# Patient Record
Sex: Male | Born: 1965 | Race: White | Hispanic: No | Marital: Married | Smoking: Never smoker
Health system: Southern US, Community
[De-identification: ages and names within clinical notes are randomized; demographics above are authoritative.]

## PROBLEM LIST (undated history)

## (undated) DIAGNOSIS — N189 Chronic kidney disease, unspecified: Secondary | ICD-10-CM

## (undated) DIAGNOSIS — E785 Hyperlipidemia, unspecified: Secondary | ICD-10-CM

## (undated) DIAGNOSIS — Z86010 Personal history of colonic polyps: Secondary | ICD-10-CM

## (undated) DIAGNOSIS — I10 Essential (primary) hypertension: Secondary | ICD-10-CM

## (undated) HISTORY — DX: Personal history of colonic polyps: Z86.010

## (undated) HISTORY — DX: Hyperlipidemia, unspecified: E78.5

## (undated) HISTORY — DX: Essential (primary) hypertension: I10

## (undated) HISTORY — DX: Chronic kidney disease, unspecified: N18.9

---

## 1999-04-16 ENCOUNTER — Emergency Department (HOSPITAL_COMMUNITY): Admission: EM | Admit: 1999-04-16 | Discharge: 1999-04-16 | Payer: Self-pay | Admitting: Emergency Medicine

## 1999-04-16 ENCOUNTER — Encounter: Payer: Self-pay | Admitting: Emergency Medicine

## 2004-11-09 ENCOUNTER — Emergency Department (HOSPITAL_COMMUNITY): Admission: EM | Admit: 2004-11-09 | Discharge: 2004-11-10 | Payer: Self-pay | Admitting: Emergency Medicine

## 2005-03-19 ENCOUNTER — Encounter: Admission: RE | Admit: 2005-03-19 | Discharge: 2005-03-19 | Payer: Self-pay | Admitting: General Surgery

## 2006-07-22 ENCOUNTER — Ambulatory Visit: Payer: Self-pay | Admitting: Family Medicine

## 2006-08-01 ENCOUNTER — Ambulatory Visit: Payer: Self-pay | Admitting: Family Medicine

## 2006-08-11 ENCOUNTER — Ambulatory Visit: Payer: Self-pay | Admitting: Family Medicine

## 2007-01-09 ENCOUNTER — Ambulatory Visit: Payer: Self-pay | Admitting: Family Medicine

## 2007-01-09 LAB — CONVERTED CEMR LAB
ALT: 25 units/L (ref 0–40)
AST: 21 units/L (ref 0–37)
BUN: 21 mg/dL (ref 6–23)
CO2: 29 meq/L (ref 19–32)
Calcium: 9.2 mg/dL (ref 8.4–10.5)
Chloride: 101 meq/L (ref 96–112)
Cholesterol: 179 mg/dL (ref 0–200)
Creatinine, Ser: 1.4 mg/dL (ref 0.4–1.5)
Creatinine,U: 89 mg/dL
Direct LDL: 113.9 mg/dL
GFR calc Af Amer: 71 mL/min
GFR calc non Af Amer: 59 mL/min
Glucose, Bld: 213 mg/dL — ABNORMAL HIGH (ref 70–99)
HDL: 31.9 mg/dL — ABNORMAL LOW (ref >39.0)
Hgb A1c MFr Bld: 7 % — ABNORMAL HIGH (ref 4.6–6.0)
Microalb Creat Ratio: 187.6 mg/g — ABNORMAL HIGH (ref 0.0–30.0)
Microalb, Ur: 16.7 mg/dL — ABNORMAL HIGH (ref 0.0–1.9)
Potassium: 4.4 meq/L (ref 3.5–5.1)
Sodium: 139 meq/L (ref 135–145)
Total CHOL/HDL Ratio: 5.6
Triglycerides: 281 mg/dL (ref 0–149)
VLDL: 56 mg/dL — ABNORMAL HIGH (ref 0–40)

## 2007-02-22 DIAGNOSIS — I10 Essential (primary) hypertension: Secondary | ICD-10-CM | POA: Insufficient documentation

## 2007-02-22 DIAGNOSIS — M109 Gout, unspecified: Secondary | ICD-10-CM | POA: Insufficient documentation

## 2007-02-22 DIAGNOSIS — R7989 Other specified abnormal findings of blood chemistry: Secondary | ICD-10-CM | POA: Insufficient documentation

## 2007-04-27 ENCOUNTER — Telehealth (INDEPENDENT_AMBULATORY_CARE_PROVIDER_SITE_OTHER): Payer: Self-pay | Admitting: *Deleted

## 2007-05-22 ENCOUNTER — Ambulatory Visit: Payer: Self-pay | Admitting: Family Medicine

## 2007-05-24 DIAGNOSIS — R944 Abnormal results of kidney function studies: Secondary | ICD-10-CM | POA: Insufficient documentation

## 2007-05-24 LAB — CONVERTED CEMR LAB
ALT: 19 units/L (ref 0–53)
AST: 18 units/L (ref 0–37)
BUN: 24 mg/dL — ABNORMAL HIGH (ref 6–23)
CO2: 30 meq/L (ref 19–32)
Calcium: 9 mg/dL (ref 8.4–10.5)
Chloride: 104 meq/L (ref 96–112)
Cholesterol: 125 mg/dL (ref 0–200)
Creatinine, Ser: 1.8 mg/dL — ABNORMAL HIGH (ref 0.4–1.5)
Direct LDL: 67.5 mg/dL
GFR calc Af Amer: 53 mL/min
GFR calc non Af Amer: 44 mL/min
Glucose, Bld: 201 mg/dL — ABNORMAL HIGH (ref 70–99)
HDL: 26.6 mg/dL — ABNORMAL LOW (ref >39.0)
Potassium: 3.8 meq/L (ref 3.5–5.1)
Sodium: 138 meq/L (ref 135–145)
Total CHOL/HDL Ratio: 4.7
Triglycerides: 250 mg/dL (ref 0–149)
VLDL: 50 mg/dL — ABNORMAL HIGH (ref 0–40)

## 2007-05-25 ENCOUNTER — Telehealth (INDEPENDENT_AMBULATORY_CARE_PROVIDER_SITE_OTHER): Payer: Self-pay | Admitting: *Deleted

## 2007-05-26 ENCOUNTER — Telehealth (INDEPENDENT_AMBULATORY_CARE_PROVIDER_SITE_OTHER): Payer: Self-pay | Admitting: *Deleted

## 2007-05-26 ENCOUNTER — Ambulatory Visit: Payer: Self-pay | Admitting: Family Medicine

## 2007-05-26 LAB — CONVERTED CEMR LAB
BUN: 21 mg/dL (ref 6–23)
Calcium: 9.4 mg/dL (ref 8.4–10.5)
Chloride: 101 meq/L (ref 96–112)
Creatinine, Ser: 1.4 mg/dL (ref 0.4–1.5)
GFR calc non Af Amer: 59 mL/min

## 2007-06-08 ENCOUNTER — Encounter: Admission: RE | Admit: 2007-06-08 | Discharge: 2007-06-08 | Payer: Self-pay | Admitting: Family Medicine

## 2007-06-09 ENCOUNTER — Telehealth (INDEPENDENT_AMBULATORY_CARE_PROVIDER_SITE_OTHER): Payer: Self-pay | Admitting: *Deleted

## 2007-06-21 ENCOUNTER — Telehealth (INDEPENDENT_AMBULATORY_CARE_PROVIDER_SITE_OTHER): Payer: Self-pay | Admitting: *Deleted

## 2007-11-27 ENCOUNTER — Telehealth (INDEPENDENT_AMBULATORY_CARE_PROVIDER_SITE_OTHER): Payer: Self-pay | Admitting: *Deleted

## 2008-01-19 ENCOUNTER — Telehealth (INDEPENDENT_AMBULATORY_CARE_PROVIDER_SITE_OTHER): Payer: Self-pay | Admitting: *Deleted

## 2008-01-25 ENCOUNTER — Ambulatory Visit: Payer: Self-pay | Admitting: Family Medicine

## 2008-01-25 LAB — CONVERTED CEMR LAB
Chloride: 103 meq/L (ref 96–112)
Cholesterol: 162 mg/dL (ref 0–200)
Creatinine,U: 195.9 mg/dL
GFR calc non Af Amer: 64 mL/min
Glucose, Bld: 149 mg/dL — ABNORMAL HIGH (ref 70–99)
HDL: 25.3 mg/dL — ABNORMAL LOW (ref >39.0)
Hgb A1c MFr Bld: 6.3 % — ABNORMAL HIGH (ref 4.6–6.0)
Potassium: 4.1 meq/L (ref 3.5–5.1)
Sodium: 136 meq/L (ref 135–145)
Total CHOL/HDL Ratio: 6.4
Triglycerides: 204 mg/dL (ref 0–149)
Uric Acid, Serum: 10.6 mg/dL — ABNORMAL HIGH (ref 2.4–7.0)
VLDL: 41 mg/dL — ABNORMAL HIGH (ref 0–40)

## 2008-01-26 ENCOUNTER — Encounter (INDEPENDENT_AMBULATORY_CARE_PROVIDER_SITE_OTHER): Payer: Self-pay | Admitting: *Deleted

## 2008-01-26 ENCOUNTER — Telehealth (INDEPENDENT_AMBULATORY_CARE_PROVIDER_SITE_OTHER): Payer: Self-pay | Admitting: *Deleted

## 2008-01-29 ENCOUNTER — Telehealth (INDEPENDENT_AMBULATORY_CARE_PROVIDER_SITE_OTHER): Payer: Self-pay | Admitting: *Deleted

## 2008-02-01 ENCOUNTER — Encounter (INDEPENDENT_AMBULATORY_CARE_PROVIDER_SITE_OTHER): Payer: Self-pay | Admitting: *Deleted

## 2008-02-22 ENCOUNTER — Ambulatory Visit: Payer: Self-pay | Admitting: Family Medicine

## 2008-02-22 DIAGNOSIS — E785 Hyperlipidemia, unspecified: Secondary | ICD-10-CM | POA: Insufficient documentation

## 2008-03-08 ENCOUNTER — Ambulatory Visit: Payer: Self-pay | Admitting: Family Medicine

## 2008-03-12 ENCOUNTER — Encounter: Payer: Self-pay | Admitting: Family Medicine

## 2008-04-26 ENCOUNTER — Ambulatory Visit: Payer: Self-pay | Admitting: Family Medicine

## 2008-04-27 LAB — CONVERTED CEMR LAB
ALT: 28 units/L (ref 0–53)
CO2: 26 meq/L (ref 19–32)
Calcium: 9 mg/dL (ref 8.4–10.5)
Cholesterol: 174 mg/dL (ref 0–200)
Creatinine, Ser: 1.4 mg/dL (ref 0.4–1.5)
Direct LDL: 111.7 mg/dL
GFR calc Af Amer: 71 mL/min
Glucose, Bld: 129 mg/dL — ABNORMAL HIGH (ref 70–99)
HDL: 29.1 mg/dL — ABNORMAL LOW (ref >39.0)
Hgb A1c MFr Bld: 6.1 % — ABNORMAL HIGH (ref 4.6–6.0)
Sodium: 138 meq/L (ref 135–145)
Total CHOL/HDL Ratio: 6
Total Protein: 7.2 g/dL (ref 6.0–8.3)
Triglycerides: 228 mg/dL (ref 0–149)
Uric Acid, Serum: 11.7 mg/dL — ABNORMAL HIGH (ref 4.0–7.8)

## 2008-04-29 ENCOUNTER — Encounter (INDEPENDENT_AMBULATORY_CARE_PROVIDER_SITE_OTHER): Payer: Self-pay | Admitting: *Deleted

## 2008-05-20 ENCOUNTER — Ambulatory Visit: Payer: Self-pay | Admitting: Family Medicine

## 2008-07-10 ENCOUNTER — Telehealth (INDEPENDENT_AMBULATORY_CARE_PROVIDER_SITE_OTHER): Payer: Self-pay | Admitting: *Deleted

## 2008-08-20 ENCOUNTER — Ambulatory Visit: Payer: Self-pay | Admitting: Family Medicine

## 2008-08-29 ENCOUNTER — Encounter (INDEPENDENT_AMBULATORY_CARE_PROVIDER_SITE_OTHER): Payer: Self-pay | Admitting: *Deleted

## 2008-08-29 LAB — CONVERTED CEMR LAB
ALT: 26 units/L (ref 0–53)
AST: 28 units/L (ref 0–37)
Alkaline Phosphatase: 91 units/L (ref 39–117)
Bilirubin, Direct: 0.1 mg/dL (ref 0.0–0.3)
CO2: 27 meq/L (ref 19–32)
Chloride: 103 meq/L (ref 96–112)
Glucose, Bld: 127 mg/dL — ABNORMAL HIGH (ref 70–99)
Potassium: 4.1 meq/L (ref 3.5–5.1)
Sodium: 139 meq/L (ref 135–145)
Total Bilirubin: 0.7 mg/dL (ref 0.3–1.2)
Total CHOL/HDL Ratio: 4.2

## 2008-10-29 ENCOUNTER — Telehealth (INDEPENDENT_AMBULATORY_CARE_PROVIDER_SITE_OTHER): Payer: Self-pay | Admitting: *Deleted

## 2008-11-04 ENCOUNTER — Telehealth (INDEPENDENT_AMBULATORY_CARE_PROVIDER_SITE_OTHER): Payer: Self-pay | Admitting: *Deleted

## 2009-03-05 ENCOUNTER — Ambulatory Visit: Payer: Self-pay | Admitting: Family Medicine

## 2009-03-06 ENCOUNTER — Ambulatory Visit: Payer: Self-pay | Admitting: Family Medicine

## 2009-03-06 ENCOUNTER — Telehealth (INDEPENDENT_AMBULATORY_CARE_PROVIDER_SITE_OTHER): Payer: Self-pay | Admitting: *Deleted

## 2009-03-11 LAB — CONVERTED CEMR LAB
Albumin: 3.5 g/dL (ref 3.5–5.2)
Alkaline Phosphatase: 94 units/L (ref 39–117)
BUN: 23 mg/dL (ref 6–23)
CO2: 27 meq/L (ref 19–32)
Calcium: 9.4 mg/dL (ref 8.4–10.5)
Cholesterol: 122 mg/dL (ref 0–200)
Creatinine, Ser: 1.5 mg/dL (ref 0.4–1.5)
Glucose, Bld: 129 mg/dL — ABNORMAL HIGH (ref 70–99)
HDL: 25.9 mg/dL — ABNORMAL LOW (ref >39.00)
Hgb A1c MFr Bld: 5.9 % (ref 4.6–6.5)
Microalb Creat Ratio: 100.3 mg/g — ABNORMAL HIGH (ref 0.0–30.0)
Microalb, Ur: 10.8 mg/dL — ABNORMAL HIGH (ref 0.0–1.9)
Sodium: 138 meq/L (ref 135–145)
Total Protein: 7.4 g/dL (ref 6.0–8.3)
Uric Acid, Serum: 11.3 mg/dL — ABNORMAL HIGH (ref 4.0–7.8)

## 2009-03-12 ENCOUNTER — Encounter (INDEPENDENT_AMBULATORY_CARE_PROVIDER_SITE_OTHER): Payer: Self-pay | Admitting: *Deleted

## 2009-05-15 ENCOUNTER — Ambulatory Visit: Payer: Self-pay | Admitting: Internal Medicine

## 2009-12-24 ENCOUNTER — Telehealth: Payer: Self-pay | Admitting: Family Medicine

## 2009-12-25 ENCOUNTER — Telehealth (INDEPENDENT_AMBULATORY_CARE_PROVIDER_SITE_OTHER): Payer: Self-pay | Admitting: *Deleted

## 2010-01-01 ENCOUNTER — Ambulatory Visit: Payer: Self-pay | Admitting: Family Medicine

## 2010-01-02 ENCOUNTER — Telehealth (INDEPENDENT_AMBULATORY_CARE_PROVIDER_SITE_OTHER): Payer: Self-pay | Admitting: *Deleted

## 2010-01-02 LAB — CONVERTED CEMR LAB
Calcium: 9 mg/dL (ref 8.4–10.5)
Chloride: 103 meq/L (ref 96–112)
Creatinine, Ser: 1.4 mg/dL (ref 0.4–1.5)
Uric Acid, Serum: 8.7 mg/dL — ABNORMAL HIGH (ref 4.0–7.8)

## 2010-01-06 ENCOUNTER — Telehealth: Payer: Self-pay | Admitting: Family Medicine

## 2010-01-20 ENCOUNTER — Telehealth: Payer: Self-pay | Admitting: Family Medicine

## 2010-01-26 ENCOUNTER — Telehealth: Payer: Self-pay | Admitting: Family Medicine

## 2010-01-29 ENCOUNTER — Ambulatory Visit: Payer: Self-pay | Admitting: Family Medicine

## 2010-02-09 ENCOUNTER — Telehealth (INDEPENDENT_AMBULATORY_CARE_PROVIDER_SITE_OTHER): Payer: Self-pay | Admitting: *Deleted

## 2010-02-09 LAB — CONVERTED CEMR LAB
ALT: 24 units/L (ref 0–53)
AST: 25 units/L (ref 0–37)
Albumin: 3.6 g/dL (ref 3.5–5.2)
Alkaline Phosphatase: 91 units/L (ref 39–117)
BUN: 12 mg/dL (ref 6–23)
Basophils Absolute: 0.1 10*3/uL (ref 0.0–0.1)
Basophils Relative: 0.7 % (ref 0.0–3.0)
Bilirubin, Direct: 0.1 mg/dL (ref 0.0–0.3)
CO2: 32 meq/L (ref 19–32)
Calcium: 9.1 mg/dL (ref 8.4–10.5)
Chloride: 103 meq/L (ref 96–112)
Cholesterol: 163 mg/dL (ref 0–200)
Creatinine, Ser: 1.3 mg/dL (ref 0.4–1.5)
Creatinine,U: 56.7 mg/dL
Eosinophils Absolute: 0.4 10*3/uL (ref 0.0–0.7)
Eosinophils Relative: 5.3 % — ABNORMAL HIGH (ref 0.0–5.0)
GFR calc non Af Amer: 63.23 mL/min (ref >60)
Glucose, Bld: 113 mg/dL — ABNORMAL HIGH (ref 70–99)
HCT: 42.9 % (ref 39.0–52.0)
HDL: 39.2 mg/dL (ref >39.00)
Hemoglobin: 14.6 g/dL (ref 13.0–17.0)
Hgb A1c MFr Bld: 5.9 % (ref 4.6–6.5)
LDL Cholesterol: 92 mg/dL (ref 0–99)
Lymphocytes Relative: 22.2 % (ref 12.0–46.0)
Lymphs Abs: 1.8 10*3/uL (ref 0.7–4.0)
MCHC: 34 g/dL (ref 30.0–36.0)
MCV: 96.4 fL (ref 78.0–100.0)
Microalb Creat Ratio: 1393.3 mg/g — ABNORMAL HIGH (ref 0.0–30.0)
Microalb, Ur: 79 mg/dL — ABNORMAL HIGH (ref 0.0–1.9)
Monocytes Absolute: 0.8 10*3/uL (ref 0.1–1.0)
Monocytes Relative: 10.3 % (ref 3.0–12.0)
Neutro Abs: 4.9 10*3/uL (ref 1.4–7.7)
Neutrophils Relative %: 61.5 % (ref 43.0–77.0)
PSA: 0.48 ng/mL (ref 0.10–4.00)
Platelets: 306 10*3/uL (ref 150.0–400.0)
Potassium: 3.6 meq/L (ref 3.5–5.1)
RBC: 4.45 M/uL (ref 4.22–5.81)
RDW: 12.7 % (ref 11.5–14.6)
Sodium: 140 meq/L (ref 135–145)
Total Bilirubin: 0.5 mg/dL (ref 0.3–1.2)
Total CHOL/HDL Ratio: 4
Total Protein: 7.8 g/dL (ref 6.0–8.3)
Triglycerides: 160 mg/dL — ABNORMAL HIGH (ref 0.0–149.0)
Uric Acid, Serum: 8.2 mg/dL — ABNORMAL HIGH (ref 4.0–7.8)
VLDL: 32 mg/dL (ref 0.0–40.0)
WBC: 8 10*3/uL (ref 4.5–10.5)

## 2010-04-01 ENCOUNTER — Telehealth: Payer: Self-pay | Admitting: Family Medicine

## 2010-12-15 NOTE — Progress Notes (Signed)
Summary: FAILED RHEUMATOLOGY REFERRAL  Phone Note Outgoing Call   Call placed by: Magdalen Spatz [facility],  January 26, 2010 2:39 PM Call placed to: Patient Summary of Call: IN REFERENCE TO RHEUMATOLOGY REFERRAL, I JUST S/W PATIENT TO INFORM HIM OF HIS OPTIONS W/LOCAL RHEUM AS A "SELF-PAY" PATIENT, AND HE IS NOW DECLINING REFERRAL, STATES HE HAS HIS CONDITION UNDER CONTROL, & CAN NOT AFFORD ANY MORE SPECIALISTS.  Initial call taken by: Magdalen Spatz [facility],  January 26, 2010 2:39 PM

## 2010-12-15 NOTE — Progress Notes (Signed)
Summary: WANTS ALTERNATIVE MED TO LISINOPRIL  Phone Note Call from Patient Call back at Home Phone [phone] Call back at Work Phone [phone]   Caller: Patient Call For: Loreen Freud DO Reason for Call: Refill Medication Summary of Call: PATIENT SPOUSE, CONNIE, CALLING, STATES LISINOPRIL RX WAS OVER $100 & THERE IS NO GENERIC.  [facility] [address] DOES HAVE ALLOPURINOL FOR $4.  WILL YOU PRESCRIBE THIS INSTEAD? Initial call taken by: Magdalen Spatz [facility],  January 06, 2010 2:11 PM  Follow-up for Phone Call        U mean uloric?  lisinopril is a bp med.   allopurinol 100 mg  #30  1 by mouth once daily---recheck uric acid in 1 month Follow-up by: Loreen Freud DO,  January 06, 2010 2:40 PM  Additional Follow-up for Phone Call Additional follow up Details #1::        Pts wife states it is the uloric. Aware that I sent in the new meds for him and to recheck his bloodwork in 1 month. Army Fossa CMA  January 06, 2010 2:58 PM     New/Updated Medications: ALLOPURINOL 100 MG TABS (ALLOPURINOL) 1 by mouth daily. Prescriptions: ALLOPURINOL 100 MG TABS (ALLOPURINOL) 1 by mouth daily.  #30 x 0   Entered by:   Army Fossa CMA   Authorized by:   Loreen Freud DO   Signed by:   Army Fossa CMA on 01/06/2010   Method used:   Electronically to        [facility] (retail)       [address]       [place], [place]  [address]       Ph: [phone]       Fax: [phone]   RxID:   [id number]

## 2010-12-15 NOTE — Progress Notes (Signed)
Summary: Lab Results  Phone Note Outgoing Call   Call placed by: Army Fossa CMA,  February 09, 2010 9:45 AM Reason for Call: Discuss lab or test results Summary of Call: Regarding lab results, LMTCB:  Ideally your LDL (bad cholesterol) should be <__100__, your HDL (good cholesterol) should be >__40_ and your triglycerides should be< 150.  Diet and exercise will increase HDL and decrease the LDL and triglycerides. Add Antara 130mg   #30, 1 by mouth  once daily,  2 refills.  We will recheck labs in _3__ months.   lipid, hep, bmp, hgba1c  250.00 272.4   If pt is symptomatic with gout we will need to tx elevated uric acid as well. Signed by Loreen Freud DO on 02/07/2010 at 3:19 PM  Follow-up for Phone Call        Left message for pt to call back. Army Fossa CMA  February 11, 2010 1:13 PM   Additional Follow-up for Phone Call Additional follow up Details #1::        left message to call back. Army Fossa CMA  February 16, 2010 2:45 PM     Additional Follow-up for Phone Call Additional follow up Details #2::    Pt states that his gout is doing well. And is aware of the rest of the lab results. Army Fossa CMA  February 16, 2010 3:33 PM   New/Updated Medications: ANTARA 130 MG CAPS (FENOFIBRATE MICRONIZED) 1 by mouth daily. Prescriptions: ANTARA 130 MG CAPS (FENOFIBRATE MICRONIZED) 1 by mouth daily.  #30 x 2   Entered by:   Army Fossa CMA   Authorized by:   Loreen Freud DO   Signed by:   Army Fossa CMA on 02/16/2010   Method used:   Electronically to        [facility] (retail)       [address]       [place], [place]  [address]       Ph: [phone]       Fax: [phone]   RxID:   [id number]

## 2010-12-15 NOTE — Progress Notes (Signed)
Summary: Med Change   Phone Note Call from Patient Call back at [phone]   Caller: Spouse Summary of Call: Pts wife said that [facility] no longer carries Colchicine. She said that there recommendation is going to be Allopurinol.Please advise. Army Fossa CMA  December 25, 2009 8:33 AM   Follow-up for Phone Call        we need to check uric acid level and bmp (for kidney function) first.  279.4 Follow-up by: Loreen Freud DO,  December 25, 2009 9:20 AM  Additional Follow-up for Phone Call Additional follow up Details #1::        left message to call back Army Fossa CMA  December 25, 2009 10:02 AM     Additional Follow-up for Phone Call Additional follow up Details #2::    Left message on VM informing patient to please call to schedule appointment for labs before med change. Once we get labs back we will consider changing meds. Shonna Chock  December 26, 2009 11:53 AM

## 2010-12-15 NOTE — Progress Notes (Signed)
Summary: DISCUSS MEDICATION  Phone Note Call from Patient Call back at Home Phone [phone] Call back at [phone] Rodney [name]   Caller: Spouse Call For: Rodney [name] Reason for Call: Refill Medication, Talk to Nurse Summary of Call: PATIENT'S [name], Rodney [name] CALLING, MEDICATION COLCHICINE, THEY HEARD THAT IT WILL BE DISCONTINUED.  ALSO THE MEDICATION IS TOO EXPENSIVE, THERE IS NO GENERIC, AND THE PATIENT HAS NO INSURANCE.  ARE THERE ANY ALTERNATIVES TO THIS MEDICATION, OR ASSISTANCE?  PLEASE CALL PT.  PATIENT SCH'D FOR CPX ON 01-29-2010 W/DR. LOWNE. Initial call taken by: Magdalen Spatz [facility],  December 24, 2009 1:23 PM  Follow-up for Phone Call        That is the cheapest --- give the discount card we have in folder on counter. Follow-up by: Rodney [name],  December 24, 2009 1:56 PM  Additional Follow-up for Phone Call Additional follow up Details #1::        Left message for [name]. I put the traid care card up front for them to pick up. Army Fossa CMA  December 24, 2009 2:06 PM     Additional Follow-up for Phone Call Additional follow up Details #2::    Pt is aware. Army Fossa CMA  December 24, 2009 2:12 PM

## 2010-12-15 NOTE — Progress Notes (Signed)
Summary: stop med  Phone Note Call from Patient Call back at [facility] Phone [phone]   Caller: Patient Summary of Call: pt stop uloric 40 mg due to bodyaches, trouble with urination and urine turninhg a brownish color..pt would like to know if there is another med that he can take in its place. pls advise...........Marland KitchenFelecia Deloach CMA  January 20, 2010 4:57 PM   Follow-up for Phone Call        I called the pt and left a message we have him on Allopurinol not uloric. Army Fossa CMA  January 20, 2010 5:08 PM   Additional Follow-up for Phone Call Additional follow up Details #1::        I spoke with the pts wife an she states that he is taking the Allopurinol. Army Fossa CMA  January 21, 2010 8:36 AM     Additional Follow-up for Phone Call Additional follow up Details #2::    only other medicine is the uloric-- but he said that was too expensive if he is struggling with symptoms we can refer to rheumatologist to see about other options Follow-up by: Loreen Freud DO,  January 21, 2010 8:56 AM  Additional Follow-up for Phone Call Additional follow up Details #3:: Details for Additional Follow-up Action Taken: Pt wifes is aware. They would like to see the rehumatologist. Army Fossa CMA  January 21, 2010 10:22 AM

## 2010-12-15 NOTE — Progress Notes (Signed)
Summary: Med to expensive  Phone Note Call from Patient Call back at [phone]   Caller: Spouse Summary of Call: Pts wife called and stated he cannot afford Anatar, he needs some type of medicine that is no more than $25.00. Pt does not have any insurance. Please advise. Army Fossa CMA  Apr 01, 2010 11:42 AM   Follow-up for Phone Call        diet and exercise are very important---he can try otc fish oil 2 by mouth two times a day  Follow-up by: Loreen Freud DO,  Apr 01, 2010 11:55 AM  Additional Follow-up for Phone Call Additional follow up Details #1::        Pts wife is aware. Army Fossa CMA  Apr 01, 2010 11:59 AM     New/Updated Medications: FISH OIL 1000 MG CAPS (OMEGA-3 FATTY ACIDS) 2 by mouth two times a day

## 2010-12-15 NOTE — Assessment & Plan Note (Signed)
Summary: CPX, FASTING, NO INSUR-SELF PAY/RH.....   Vital Signs:  Patient profile:   45 year old male Weight:      237 pounds Temp:     98.7 degrees F oral Pulse rate:   76 / minute Pulse rhythm:   regular BP sitting:   126 / 76  (left arm) Cuff size:   regular  Vitals Entered By: Army Fossa CMA (January 29, 2010 8:44 AM) CC: Pt does not want CPX, here for a BP follow up.    History of Present Illness:  Type 1 diabetes mellitus follow-up      This is a 45 year old man who presents with Type 2 diabetes mellitus follow-up.  The patient denies polyuria, polydipsia, blurred vision, self managed hypoglycemia, hypoglycemia requiring help, weight loss, weight gain, and numbness of extremities.  The patient denies the following symptoms: neuropathic pain, chest pain, vomiting, orthostatic symptoms, poor wound healing, intermittent claudication, vision loss, and foot ulcer.  Since the last visit the patient reports good dietary compliance, compliance with medications, exercising regularly, and monitoring blood glucose.  The patient has been measuring capillary blood glucose before breakfast.  Since the last visit, the patient reports having had no eye care and no foot care.    Hyperlipidemia follow-up      The patient also presents for Hyperlipidemia follow-up.  The patient denies muscle aches, GI upset, abdominal pain, flushing, itching, constipation, diarrhea, and fatigue.  The patient denies the following symptoms: chest pain/pressure, exercise intolerance, dypsnea, palpitations, syncope, and pedal edema.  Compliance with medications (by patient report) has been near 100%.  Dietary compliance has been good.  The patient reports exercising 3-4X per week.  Adjunctive measures currently used by the patient include ASA and limiting alcohol consumpton.    Hypertension follow-up      The patient also presents for Hypertension follow-up.  The patient denies lightheadedness, urinary frequency,  headaches, edema, impotence, rash, and fatigue.  The patient denies the following associated symptoms: chest pain, chest pressure, exercise intolerance, dyspnea, palpitations, syncope, leg edema, and pedal edema.  Compliance with medications (by patient report) has been near 100%.  The patient reports that dietary compliance has been good.  The patient reports exercising 3-4X per week.  Adjunctive measures currently used by the patient include salt restriction.    Allergies: 1)  ! Zocor (Simvastatin)  Past History:  Past medical, surgical, family and social histories (including risk factors) reviewed for relevance to current acute and chronic problems.  Past Medical History: Reviewed history from 02/22/2008 and no changes required. Gout Hypertension Hyperlipidemia Diabetes mellitus, type II  Family History: Reviewed history and no changes required.  Social History: Reviewed history and no changes required.  Physical Exam  General:  Well-developed,well-nourished,in no acute distress; alert,appropriate and cooperative throughout examination Neck:  No deformities, masses, or tenderness noted. Lungs:  Normal respiratory effort, chest expands symmetrically. Lungs are clear to auscultation, no crackles or wheezes. Heart:  Normal rate and regular rhythm. S1 and S2 normal without gallop, murmur, click, rub or other extra sounds. Extremities:  No clubbing, cyanosis, edema, or deformity noted with normal full range of motion of all joints.    Diabetes Management Exam:    Foot Exam (with socks and/or shoes not present):       Sensory-Pinprick/Light touch:          Left medial foot (L-4): normal          Left dorsal foot (L-5): normal  Left lateral foot (S-1): normal          Right medial foot (L-4): normal          Right dorsal foot (L-5): normal          Right lateral foot (S-1): normal       Sensory-Monofilament:          Left foot: normal          Right foot: normal        Inspection:          Left foot: normal          Right foot: normal       Nails:          Left foot: normal          Right foot: normal    Eye Exam:       Eye Exam done here today   Impression & Recommendations:  Problem # 1:  DIABETES MELLITUS, TYPE II (ICD-250.00)  His updated medication list for this problem includes:    Adult Aspirin Ec Low Strength 81 Mg Tbec (Aspirin) .Marland Kitchen... Take one tablet daily    Lisinopril 20 Mg Tabs (Lisinopril) .Marland Kitchen... 1 by mouth once daily. **office visit due**  Orders: Venipuncture (09811) TLB-Lipid Panel (80061-LIPID) TLB-BMP (Basic Metabolic Panel-BMET) (80048-METABOL) TLB-CBC Platelet - w/Differential (85025-CBCD) TLB-Hepatic/Liver Function Pnl (80076-HEPATIC) TLB-A1C / Hgb A1C (Glycohemoglobin) (83036-A1C) TLB-Microalbumin/Creat Ratio, Urine (82043-MALB) TLB-Uric Acid, Blood (84550-URIC) TLB-PSA (Prostate Specific Antigen) (84153-PSA)  Labs Reviewed: Creat: 1.4 (01/01/2010)    Reviewed HgBA1c results: 5.9 (03/06/2009)  6.2 (08/20/2008)  Problem # 2:  HYPERLIPIDEMIA (ICD-272.4)  His updated medication list for this problem includes:    Pravachol 20 Mg Tabs (Pravastatin sodium) .Marland Kitchen... Take one tablet each evening at bedtime.  Orders: Venipuncture (91478) TLB-Lipid Panel (80061-LIPID) TLB-BMP (Basic Metabolic Panel-BMET) (80048-METABOL) TLB-CBC Platelet - w/Differential (85025-CBCD) TLB-Hepatic/Liver Function Pnl (80076-HEPATIC) TLB-A1C / Hgb A1C (Glycohemoglobin) (83036-A1C) TLB-Microalbumin/Creat Ratio, Urine (82043-MALB) TLB-Uric Acid, Blood (84550-URIC) TLB-PSA (Prostate Specific Antigen) (84153-PSA)  Labs Reviewed: SGOT: 19 (03/06/2009)   SGPT: 16 (03/06/2009)  Prior 10 Yr Risk Heart Disease: 11 % (05/15/2009)   HDL:25.90 (03/06/2009), 28.3 (08/20/2008)  LDL:73 (03/06/2009), 52 (08/20/2008)  Chol:122 (03/06/2009), 118 (08/20/2008)  Trig:115.0 (03/06/2009), 188 (08/20/2008)  Problem # 3:  HYPERTENSION (ICD-401.9)  His updated  medication list for this problem includes:    Verapamil Hcl 120 Mg Tabs (Verapamil hcl) .Marland Kitchen... Take one and a half tablet two times a day    Atenolol 100 Mg Tabs (Atenolol) .Marland Kitchen... Take 1/2 tablet by mouth each day.    Lisinopril 20 Mg Tabs (Lisinopril) .Marland Kitchen... 1 by mouth once daily. **office visit due**  Orders: Venipuncture (29562) TLB-Lipid Panel (80061-LIPID) TLB-BMP (Basic Metabolic Panel-BMET) (80048-METABOL) TLB-CBC Platelet - w/Differential (85025-CBCD) TLB-Hepatic/Liver Function Pnl (80076-HEPATIC) TLB-A1C / Hgb A1C (Glycohemoglobin) (83036-A1C) TLB-Microalbumin/Creat Ratio, Urine (82043-MALB) TLB-Uric Acid, Blood (84550-URIC) TLB-PSA (Prostate Specific Antigen) (84153-PSA)  BP today: 126/76 Prior BP: 120/90 (05/15/2009)  Prior 10 Yr Risk Heart Disease: 11 % (05/15/2009)  Labs Reviewed: K+: 4.1 (01/01/2010) Creat: : 1.4 (01/01/2010)   Chol: 122 (03/06/2009)   HDL: 25.90 (03/06/2009)   LDL: 73 (03/06/2009)   TG: 115.0 (03/06/2009)  Problem # 4:  GOUT (ICD-274.9)  The following medications were removed from the medication list:    Allopurinol 100 Mg Tabs (Allopurinol) .Marland Kitchen... 1 by mouth daily.  Orders: Venipuncture (13086) TLB-Lipid Panel (80061-LIPID) TLB-BMP (Basic Metabolic Panel-BMET) (80048-METABOL) TLB-CBC Platelet - w/Differential (85025-CBCD) TLB-Hepatic/Liver Function Pnl (80076-HEPATIC) TLB-A1C / Hgb  A1C (Glycohemoglobin) (83036-A1C) TLB-Microalbumin/Creat Ratio, Urine (82043-MALB) TLB-Uric Acid, Blood (84550-URIC) TLB-PSA (Prostate Specific Antigen) (84153-PSA)  Elevate extremity; warm compresses, symptomatic relief and medication as directed.   Complete Medication List: 1)  Verapamil Hcl 120 Mg Tabs (Verapamil hcl) .... Take one and a half tablet two times a day 2)  Atenolol 100 Mg Tabs (Atenolol) .... Take 1/2 tablet by mouth each day. 3)  Adult Aspirin Ec Low Strength 81 Mg Tbec (Aspirin) .... Take one tablet daily 4)  Lisinopril 20 Mg Tabs (Lisinopril)  .Marland Kitchen.. 1 by mouth once daily. **office visit due** 5)  Pravachol 20 Mg Tabs (Pravastatin sodium) .... Take one tablet each evening at bedtime. 6)  Lotrisone 1-0.05 % Crea (Clotrimazole-betamethasone) .... Apply two times a day Prescriptions: PRAVACHOL 20 MG TABS (PRAVASTATIN SODIUM) Take one tablet each evening at bedtime.  #30 x 5   Entered and Authorized by:   Loreen Freud DO   Signed by:   Loreen Freud DO on 01/29/2010   Method used:   Electronically to        [facility] (retail)       [address]       [place], [place]  [address]       Ph: [phone]       Fax: [phone]   RxID:   [id number] LISINOPRIL 20 MG TABS (LISINOPRIL) 1 by mouth once daily. **OFFICE VISIT DUE**  #30 x 5   Entered and Authorized by:   Loreen Freud DO   Signed by:   Loreen Freud DO on 01/29/2010   Method used:   Electronically to        [facility] (retail)       [address]       [place], [place]  [address]       Ph: [phone]       Fax: [phone]   RxID:   [id number] ATENOLOL 100 MG  TABS (ATENOLOL) Take 1/2 tablet by mouth each day.  #30 Each x 5   Entered and Authorized by:   Loreen Freud DO   Signed by:   Loreen Freud DO on 01/29/2010   Method used:   Electronically to        [facility] (retail)       [address]       [place], [place]  [address]       Ph: [phone]       Fax: [phone]   RxID:   [id number] VERAPAMIL HCL 120 MG TABS (VERAPAMIL HCL) take one and a half tablet two times a day  #90 x 5   Entered and Authorized by:   Loreen Freud DO   Signed by:   Loreen Freud DO on 01/29/2010   Method used:   Electronically to        [facility] (retail)       [address]       [place], [place]  [address]       Ph: [phone]       Fax: [phone]   RxID:   [id number] LOTRISONE 1-0.05 % CREA (CLOTRIMAZOLE-BETAMETHASONE) apply  two times a day  #30g x 2   Entered and Authorized by:   Loreen Freud DO   Signed  by:   Loreen Freud DO on 01/29/2010   Method used:   Electronically to        [facility] (retail)       [address]       [place], [place]  [address]       Ph: [phone]       Fax: [phone]   RxID:   [id number]   Appended Document: CPX, FASTING, NO INSUR-SELF PAY/RH.....  Laboratory Results   Urine Tests   Date/Time Reported: January 29, 2010 9:29 AM   Routine Urinalysis   Color: yellow Appearance: Clear Glucose: negative   (Normal Range: Negative) Bilirubin: negative   (Normal Range: Negative) Ketone: negative   (Normal Range: Negative) Spec. Gravity: 1.015   (Normal Range: 1.003-1.035) Blood: large   (Normal Range: Negative) pH: 7.0   (Normal Range: 5.0-8.0) Protein: 100   (Normal Range: Negative) Urobilinogen: negative   (Normal Range: 0-1) Nitrite: negative   (Normal Range: Negative) Leukocyte Esterace: negative   (Normal Range: Negative)    Comments: Floydene Flock  January 29, 2010 9:30 AM cx sent

## 2010-12-15 NOTE — Progress Notes (Signed)
Summary: Lab Results/New Meds  Phone Note Outgoing Call   Call placed by: Army Fossa CMA,  January 02, 2010 12:11 PM Summary of Call: Regarding lab results, LMTCB:  New med has been sent to pharm:  start uloric 40 mg #30  1po once daily  2 refills     Follow-up for Phone Call        Pt is aware. Army Fossa CMA  January 05, 2010 1:33 PM

## 2011-01-26 ENCOUNTER — Encounter: Payer: Self-pay | Admitting: Family Medicine

## 2011-02-13 ENCOUNTER — Encounter: Payer: Self-pay | Admitting: Family Medicine

## 2011-02-16 ENCOUNTER — Ambulatory Visit (INDEPENDENT_AMBULATORY_CARE_PROVIDER_SITE_OTHER): Payer: Self-pay | Admitting: Family Medicine

## 2011-02-16 ENCOUNTER — Encounter: Payer: Self-pay | Admitting: Family Medicine

## 2011-02-16 VITALS — BP 110/70 | HR 66 | Ht 66.0 in | Wt 227.6 lb

## 2011-02-16 DIAGNOSIS — M109 Gout, unspecified: Secondary | ICD-10-CM

## 2011-02-16 DIAGNOSIS — I10 Essential (primary) hypertension: Secondary | ICD-10-CM

## 2011-02-16 DIAGNOSIS — E785 Hyperlipidemia, unspecified: Secondary | ICD-10-CM

## 2011-02-16 DIAGNOSIS — Z0000 Encounter for general adult medical examination without abnormal findings: Secondary | ICD-10-CM | POA: Insufficient documentation

## 2011-02-16 DIAGNOSIS — E119 Type 2 diabetes mellitus without complications: Secondary | ICD-10-CM

## 2011-02-16 DIAGNOSIS — R319 Hematuria, unspecified: Secondary | ICD-10-CM

## 2011-02-16 LAB — POCT URINALYSIS DIPSTICK
Bilirubin, UA: NEGATIVE
Ketones, UA: NEGATIVE
Leukocytes, UA: NEGATIVE
Nitrite, UA: NEGATIVE
Protein, UA: 100
Spec Grav, UA: 1.01
Urobilinogen, UA: NEGATIVE
pH, UA: 5

## 2011-02-16 LAB — CBC WITH DIFFERENTIAL/PLATELET
Basophils Absolute: 0 10*3/uL (ref 0.0–0.1)
Basophils Relative: 0.4 % (ref 0.0–3.0)
Eosinophils Absolute: 0.4 10*3/uL (ref 0.0–0.7)
HCT: 35.8 % — ABNORMAL LOW (ref 39.0–52.0)
Hemoglobin: 12.4 g/dL — ABNORMAL LOW (ref 13.0–17.0)
Lymphs Abs: 1.2 10*3/uL (ref 0.7–4.0)
MCHC: 34.6 g/dL (ref 30.0–36.0)
Neutro Abs: 5.4 10*3/uL (ref 1.4–7.7)
RBC: 3.13 Mil/uL — ABNORMAL LOW (ref 4.22–5.81)
RDW: 15.1 % — ABNORMAL HIGH (ref 11.5–14.6)

## 2011-02-16 MED ORDER — ATENOLOL 100 MG PO TABS: ORAL_TABLET | ORAL | Status: DC

## 2011-02-16 MED ORDER — TETANUS-DIPHTH-ACELL PERTUSSIS 5-2.5-18.5 LF-MCG/0.5 IM SUSP: 0.5000 mL | Freq: Once | INTRAMUSCULAR | Status: DC

## 2011-02-16 MED ORDER — VERAPAMIL HCL 120 MG PO TABS: ORAL_TABLET | ORAL | Status: DC

## 2011-02-16 MED ORDER — ATENOLOL 100 MG PO TABS: 100.0000 mg | ORAL_TABLET | ORAL | Status: DC

## 2011-02-16 MED ORDER — LISINOPRIL 20 MG PO TABS: 20.0000 mg | ORAL_TABLET | Freq: Every day | ORAL | Status: DC

## 2011-02-16 MED ORDER — VERAPAMIL HCL 120 MG PO TABS: 120.0000 mg | ORAL_TABLET | ORAL | Status: DC

## 2011-02-16 NOTE — Assessment & Plan Note (Signed)
CON'T MEDS CHECK LABS

## 2011-02-16 NOTE — Assessment & Plan Note (Signed)
STATINS CAUSED LEG CRAMPS CHECK LABS TODAY

## 2011-02-16 NOTE — Progress Notes (Signed)
Addended by: Floydene Flock on: 02/16/2011 01:46 PM   Modules accepted: Orders

## 2011-02-16 NOTE — Progress Notes (Signed)
  Subjective:    Patient ID: Rodney [name], male    DOB: [birth year], 45 y.o.   MRN: [id number]  HPI Pt is here for cpe and labs.  No complaints.   HYPERTENSION Disease Monitoring Blood pressure range-120/70  Chest pain- no      Dyspnea- no Medications Compliance- good Lightheadedness- no   Edema- no   DIABETES Disease Monitoring Blood Sugar ranges- [id number] Polyuria- no New Visual problems- no Medications Compliance- no meds Hypoglycemic symptoms- none   HYPERLIPIDEMIA Disease Monitoring See symptoms for Hypertension Medications Compliance- on no meds  RUQ pain- no  Muscle aches- no  ROS See HPI above   PMH Smoking Status noted     Review of Systems  All other systems reviewed and are negative.      as above Objective:   Physical Exam  Constitutional: He is oriented to person, place, and time. He appears well-developed and well-nourished. No distress.  HENT:  Head: Normocephalic and atraumatic.  Right Ear: External ear normal.  Left Ear: External ear normal.  Nose: Nose normal.  Mouth/Throat: Oropharynx is clear and moist. No oropharyngeal exudate.  Eyes: Conjunctivae and EOM are normal. Pupils are equal, round, and reactive to light. Right eye exhibits no discharge. Left eye exhibits no discharge. No scleral icterus.  Neck: Normal range of motion. Neck supple. No thyromegaly present.  Cardiovascular: Normal rate, regular rhythm and normal heart sounds.   No murmur heard. Pulmonary/Chest: Effort normal and breath sounds normal. No respiratory distress. He has no wheezes. He has no rales. He exhibits no tenderness.  Abdominal: Soft. Bowel sounds are normal. He exhibits no distension and no mass. There is no tenderness. There is no rebound and no guarding. Hernia confirmed negative in the right inguinal area and confirmed negative in the left inguinal area.  Genitourinary: Rectum normal, testes normal and penis normal. Rectal exam shows no external hemorrhoid, no  internal hemorrhoid, no fissure, no mass, no tenderness and anal tone normal. Prostate is enlarged. Right testis shows no mass, no swelling and no tenderness. Right testis is descended. Left testis shows no mass, no swelling and no tenderness. Left testis is descended. No penile tenderness.  Musculoskeletal: Normal range of motion. He exhibits no edema and no tenderness.  Lymphadenopathy:    He has no cervical adenopathy.       Right: No inguinal adenopathy present.       Left: Inguinal adenopathy present.  Neurological: He is alert and oriented to person, place, and time. He has normal reflexes. He displays normal reflexes. No cranial nerve deficit. Coordination normal.  Skin: Skin is warm. No rash noted. He is diaphoretic. No erythema. No pallor.  Psychiatric: He has a normal mood and affect. His behavior is normal. Judgment and thought content normal.  Sensory exam of the foot is normal, tested with the monofilament. Good pulses, no lesions or ulcers, good peripheral pulses.        Assessment & Plan:

## 2011-02-16 NOTE — Assessment & Plan Note (Signed)
CHECK LABS PT ON NO MEDS

## 2011-02-16 NOTE — Assessment & Plan Note (Signed)
[facility] UTD CHECK LABS TODAY

## 2011-02-16 NOTE — Progress Notes (Signed)
Addended by: Floydene Flock on: 02/16/2011 03:17 PM   Modules accepted: Orders

## 2011-02-17 LAB — LIPID PANEL
Cholesterol: 141 mg/dL (ref 0–200)
HDL: 31.4 mg/dL — ABNORMAL LOW (ref >39.00)
LDL Cholesterol: 91 mg/dL (ref 0–99)
Total CHOL/HDL Ratio: 4
Triglycerides: 93 mg/dL (ref 0.0–149.0)

## 2011-02-17 LAB — BASIC METABOLIC PANEL
CO2: 28 mEq/L (ref 19–32)
Calcium: 9.2 mg/dL (ref 8.4–10.5)
Chloride: 105 mEq/L (ref 96–112)
Potassium: 4.9 mEq/L (ref 3.5–5.1)
Sodium: 142 mEq/L (ref 135–145)

## 2011-02-17 LAB — MICROALBUMIN / CREATININE URINE RATIO
Creatinine,U: 100.1 mg/dL
Microalb, Ur: 39.5 mg/dL — ABNORMAL HIGH (ref 0.0–1.9)

## 2011-02-18 ENCOUNTER — Telehealth: Payer: Self-pay | Admitting: *Deleted

## 2011-02-18 LAB — URINE CULTURE
Colony Count: NO GROWTH
Organism ID, Bacteria: NO GROWTH

## 2011-02-18 NOTE — Telephone Encounter (Addendum)
Pt decline any symptoms as now related to high uric acid level. Pt will come next week to pick up 24 hr urine  Message copied by Candie Echevaria on Thu Feb 18, 2011  3:29 PM ------      Message from: Loreen Freud      Created: Wed Feb 17, 2011  1:51 PM       Glucose is creeping up again--- watch diet, simple sugars, starches etc.      Cholesterol is good.  Recheck 6 months      Uric acid is high but pt is asymptomatic        272.4  250.00  Hgba1c, bmp, lipid, hep      microalbumin is high---- we need 24 urine for protein and creatinine

## 2011-02-22 NOTE — Progress Notes (Signed)
Letter mailed     KP 

## 2011-02-23 NOTE — Telephone Encounter (Signed)
Spoke with patient and made him aware of recommendations and he agreed and voiced understanding. No concerns at this time     KP

## 2011-02-23 NOTE — Telephone Encounter (Signed)
We were unable to get old EKG---if any Chest pain, palp etc refer to cardio

## 2011-03-02 ENCOUNTER — Emergency Department (HOSPITAL_COMMUNITY)
Admission: EM | Admit: 2011-03-02 | Discharge: 2011-03-02 | Disposition: A | Discharge status: Home / Self Care | Payer: Self-pay | Attending: Emergency Medicine | Admitting: Emergency Medicine

## 2011-03-02 DIAGNOSIS — I10 Essential (primary) hypertension: Secondary | ICD-10-CM | POA: Insufficient documentation

## 2011-03-02 DIAGNOSIS — E119 Type 2 diabetes mellitus without complications: Secondary | ICD-10-CM | POA: Insufficient documentation

## 2011-03-02 DIAGNOSIS — Z79899 Other long term (current) drug therapy: Secondary | ICD-10-CM | POA: Insufficient documentation

## 2011-03-02 DIAGNOSIS — R209 Unspecified disturbances of skin sensation: Secondary | ICD-10-CM | POA: Insufficient documentation

## 2011-03-30 ENCOUNTER — Encounter: Payer: Self-pay | Admitting: Family Medicine

## 2011-05-26 ENCOUNTER — Ambulatory Visit (INDEPENDENT_AMBULATORY_CARE_PROVIDER_SITE_OTHER): Payer: Self-pay | Admitting: Family Medicine

## 2011-05-26 ENCOUNTER — Encounter: Payer: Self-pay | Admitting: Family Medicine

## 2011-05-26 VITALS — BP 140/90 | HR 75 | Wt 225.2 lb

## 2011-05-26 DIAGNOSIS — R209 Unspecified disturbances of skin sensation: Secondary | ICD-10-CM

## 2011-05-26 DIAGNOSIS — R200 Anesthesia of skin: Secondary | ICD-10-CM | POA: Insufficient documentation

## 2011-05-26 LAB — HEPATIC FUNCTION PANEL
ALT: 17 U/L (ref 0–53)
AST: 27 U/L (ref 0–37)
Bilirubin, Direct: 0.2 mg/dL (ref 0.0–0.3)
Total Bilirubin: 1.2 mg/dL (ref 0.3–1.2)
Total Protein: 7.8 g/dL (ref 6.0–8.3)

## 2011-05-26 LAB — CBC WITH DIFFERENTIAL/PLATELET
Basophils Relative: 0.6 % (ref 0.0–3.0)
Eosinophils Relative: 6.1 % — ABNORMAL HIGH (ref 0.0–5.0)
HCT: 29.8 % — ABNORMAL LOW (ref 39.0–52.0)
Hemoglobin: 10.4 g/dL — ABNORMAL LOW (ref 13.0–17.0)
Lymphs Abs: 1.5 10*3/uL (ref 0.7–4.0)
Monocytes Relative: 3.7 % (ref 3.0–12.0)
Platelets: 212 10*3/uL (ref 150.0–400.0)
RBC: 2.55 Mil/uL — ABNORMAL LOW (ref 4.22–5.81)
WBC: 7.6 10*3/uL (ref 4.5–10.5)

## 2011-05-26 LAB — BASIC METABOLIC PANEL
BUN: 23 mg/dL (ref 6–23)
Chloride: 108 mEq/L (ref 96–112)
GFR: 61.23 mL/min (ref >60.00)
Potassium: 4.6 mEq/L (ref 3.5–5.1)
Sodium: 140 mEq/L (ref 135–145)

## 2011-05-26 LAB — TSH: TSH: 1.59 u[IU]/mL (ref 0.35–5.50)

## 2011-05-26 NOTE — Progress Notes (Signed)
  Subjective:    Patient ID: Rodney [name], male    DOB: 04-Jan-1966, 45 y.o.   MRN: [id number]  HPI Pt here c/o numbness in fingers for 1 month.  No known injury.  He was told in the past he had carpal tunnel.  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  He has been dropping things.     Review of Systems As above    Objective:   Physical Exam  Constitutional: He is oriented to person, place, and time. He appears well-developed and well-nourished.  Cardiovascular: Intact distal pulses.   Musculoskeletal: Normal range of motion. He exhibits no edema and no tenderness.  Neurological: He is alert and oriented to person, place, and time. He has normal strength.       No increase in numbness with flexion of wrists Fingers are numb all the time  Skin: Skin is warm and dry. No rash noted. No erythema. There is pallor.          Assessment & Plan:

## 2011-05-26 NOTE — Assessment & Plan Note (Signed)
Refer to hand surgeon Pt will get splints for both hands Check labs

## 2011-05-26 NOTE — Patient Instructions (Signed)
Carpal Tunnel Syndrome You may have carpal tunnel syndrome. This is a common condition. Carpal tunnel syndrome occurs when the tendons, bones, or ligaments in the wrist press against the median nerve as it passes into the hand.  Symptoms can include:  Intermittent numbness.   Pain or a tingling sensation in thumb and first two fingers.  The pain may radiate up to the shoulder. There may even be weakness in the hand muscles. The pain is often worse at night and in the early morning. Nerve conduction tests may be used to prove the diagnosis. Carpal tunnel syndrome is most often due to repeated movements of the hand or wrist. Other causes can include:  Prior injuries.  Diabetes.   Obesity.  Smoking.   Pregnancy. Symptoms that develop during pregnancy often stop when the pregnancy is over.   Treatment includes:  Splinting - A wrist splint helps prevent movements that irritate the nerve. Splints are especially helpful at night when the symptoms are often worse.   Ice packs - Cold packs applied to the palm side of the wrist for 20 minutes every 2 hours while awake may give some relief.   Medication - Medicine to reduce inflammation and pain are often used. Cortisone injections around the nerve may also bring improvement.  Severe cases of carpal tunnel syndrome can require surgery to relieve the pressure on the nerve. This may be necessary if there is evidence of weakness or decreased sensation in your hand, or if your symptoms do not improve with conservative treatment. See your caregiver for follow-up to be certain your condition is improving. Document Released: 12/09/2004 Document Re-Released: 05/15/2007 [facility] Patient Information 2011 [place], [place].

## 2011-06-02 ENCOUNTER — Telehealth: Payer: Self-pay

## 2011-06-02 NOTE — Telephone Encounter (Signed)
Message copied by Arnette Norris on Wed Jun 02, 2011 11:26 AM ------      Message from: Lelon Perla      Created: Sun May 30, 2011  7:44 PM       Pt is anemic and B12 level is low------  Start b12 injections 2x a week for 4 weeks then weekly x4---then monthly      Recheck 1 month       Fax to neuro

## 2011-06-02 NOTE — Telephone Encounter (Signed)
Mssg left on voicemail-----  KP

## 2011-06-04 NOTE — Telephone Encounter (Addendum)
Discussed results with Rodney [name] and he stated the Neurologist wants to do surgery and he does not agree. I advise that he can ask for a second pinion and if he had any problems he could follow up with Dr.Lowne Prn. He voiced understanding. Will faxed to [facility]     KP

## 2011-06-04 NOTE — Telephone Encounter (Signed)
Discuss with patient, appt scheduled. 

## 2011-06-07 ENCOUNTER — Ambulatory Visit (INDEPENDENT_AMBULATORY_CARE_PROVIDER_SITE_OTHER): Payer: Self-pay | Admitting: *Deleted

## 2011-06-07 DIAGNOSIS — E538 Deficiency of other specified B group vitamins: Secondary | ICD-10-CM

## 2011-06-07 MED ORDER — CYANOCOBALAMIN 1000 MCG/ML IJ SOLN
1000.0000 ug | Freq: Once | INTRAMUSCULAR | Status: AC
  Administered 2011-06-07: 1000 ug via INTRAMUSCULAR

## 2011-06-11 ENCOUNTER — Ambulatory Visit (INDEPENDENT_AMBULATORY_CARE_PROVIDER_SITE_OTHER): Payer: Self-pay | Admitting: *Deleted

## 2011-06-11 DIAGNOSIS — E538 Deficiency of other specified B group vitamins: Secondary | ICD-10-CM

## 2011-06-11 MED ORDER — CYANOCOBALAMIN 1000 MCG/ML IJ SOLN
1000.0000 ug | Freq: Once | INTRAMUSCULAR | Status: AC
  Administered 2011-06-11: 1000 ug via INTRAMUSCULAR

## 2011-06-14 ENCOUNTER — Telehealth: Payer: Self-pay | Admitting: *Deleted

## 2011-06-14 ENCOUNTER — Ambulatory Visit (HOSPITAL_BASED_OUTPATIENT_CLINIC_OR_DEPARTMENT_OTHER): Admission: RE | Admit: 2011-06-14 | Payer: Self-pay | Source: Ambulatory Visit | Admitting: Orthopedic Surgery

## 2011-06-14 MED ORDER — NAPROXEN 250 MG PO TABS: ORAL_TABLET | ORAL | Status: DC

## 2011-06-14 NOTE — Telephone Encounter (Signed)
Pt wife states that Pt is c/o pain in heel of foot, fingers, shoulder, hands which is making it difficult to walk. Pt would like to have pain med Rx. Pt wife notes that Pt does not have insurance so they are requesting a cheap med.Please advise

## 2011-06-14 NOTE — Telephone Encounter (Signed)
Discuss with patient and his wife 

## 2011-06-14 NOTE — Telephone Encounter (Signed)
Typically gout causes pain in 1 joint at a time- it is unusual for it to be so widespread.  He really should have an office visit to evaluate this.  He can have Naproxen 250mg  tabs- take 3 tabs for 1st dose and then 1 tab every 8 hrs until pain improves or he can get to office (#30).  He should limit his intake of high purine foods- including shell fish, beer/ETOH, beef.

## 2011-06-14 NOTE — Telephone Encounter (Signed)
Left message to call office

## 2011-06-18 ENCOUNTER — Ambulatory Visit (INDEPENDENT_AMBULATORY_CARE_PROVIDER_SITE_OTHER): Payer: Self-pay | Admitting: *Deleted

## 2011-06-18 DIAGNOSIS — E538 Deficiency of other specified B group vitamins: Secondary | ICD-10-CM

## 2011-06-18 MED ORDER — CYANOCOBALAMIN 1000 MCG/ML IJ SOLN
1000.0000 ug | Freq: Once | INTRAMUSCULAR | Status: AC
  Administered 2011-06-18: 1000 ug via INTRAMUSCULAR

## 2011-06-21 ENCOUNTER — Ambulatory Visit (INDEPENDENT_AMBULATORY_CARE_PROVIDER_SITE_OTHER): Payer: Self-pay

## 2011-06-21 DIAGNOSIS — D519 Vitamin B12 deficiency anemia, unspecified: Secondary | ICD-10-CM

## 2011-06-21 DIAGNOSIS — D518 Other vitamin B12 deficiency anemias: Secondary | ICD-10-CM

## 2011-06-21 MED ORDER — CYANOCOBALAMIN 1000 MCG/ML IJ SOLN
1000.0000 ug | Freq: Once | INTRAMUSCULAR | Status: AC
  Administered 2011-06-21: 1000 ug via INTRAMUSCULAR

## 2011-06-25 ENCOUNTER — Ambulatory Visit (INDEPENDENT_AMBULATORY_CARE_PROVIDER_SITE_OTHER): Payer: Self-pay | Admitting: *Deleted

## 2011-06-25 DIAGNOSIS — E538 Deficiency of other specified B group vitamins: Secondary | ICD-10-CM

## 2011-06-25 MED ORDER — CYANOCOBALAMIN 1000 MCG/ML IJ SOLN
1000.0000 ug | Freq: Once | INTRAMUSCULAR | Status: AC
  Administered 2011-06-25: 1000 ug via INTRAMUSCULAR

## 2011-06-28 ENCOUNTER — Ambulatory Visit (INDEPENDENT_AMBULATORY_CARE_PROVIDER_SITE_OTHER): Payer: Self-pay | Admitting: *Deleted

## 2011-06-28 DIAGNOSIS — E538 Deficiency of other specified B group vitamins: Secondary | ICD-10-CM

## 2011-06-28 MED ORDER — CYANOCOBALAMIN 1000 MCG/ML IJ SOLN
1000.0000 ug | Freq: Once | INTRAMUSCULAR | Status: AC
  Administered 2011-06-28: 1000 ug via INTRAMUSCULAR

## 2011-07-02 ENCOUNTER — Ambulatory Visit (INDEPENDENT_AMBULATORY_CARE_PROVIDER_SITE_OTHER): Payer: Self-pay | Admitting: *Deleted

## 2011-07-02 DIAGNOSIS — E538 Deficiency of other specified B group vitamins: Secondary | ICD-10-CM

## 2011-07-02 MED ORDER — CYANOCOBALAMIN 1000 MCG/ML IJ SOLN
1000.0000 ug | Freq: Once | INTRAMUSCULAR | Status: AC
  Administered 2011-07-02: 1000 ug via INTRAMUSCULAR

## 2011-07-09 ENCOUNTER — Ambulatory Visit (INDEPENDENT_AMBULATORY_CARE_PROVIDER_SITE_OTHER): Payer: Self-pay | Admitting: *Deleted

## 2011-07-09 DIAGNOSIS — E538 Deficiency of other specified B group vitamins: Secondary | ICD-10-CM

## 2011-07-09 MED ORDER — CYANOCOBALAMIN 1000 MCG/ML IJ SOLN
1000.0000 ug | Freq: Once | INTRAMUSCULAR | Status: AC
  Administered 2011-07-09: 1000 ug via INTRAMUSCULAR

## 2011-08-10 ENCOUNTER — Ambulatory Visit (INDEPENDENT_AMBULATORY_CARE_PROVIDER_SITE_OTHER): Payer: Self-pay

## 2011-08-10 DIAGNOSIS — E538 Deficiency of other specified B group vitamins: Secondary | ICD-10-CM

## 2011-08-10 MED ORDER — CYANOCOBALAMIN 1000 MCG/ML IJ SOLN: 1000.0000 ug | Freq: Once | INTRAMUSCULAR | Status: DC

## 2011-08-17 ENCOUNTER — Ambulatory Visit (INDEPENDENT_AMBULATORY_CARE_PROVIDER_SITE_OTHER): Payer: Self-pay | Admitting: *Deleted

## 2011-08-17 DIAGNOSIS — E538 Deficiency of other specified B group vitamins: Secondary | ICD-10-CM

## 2011-08-17 MED ORDER — CYANOCOBALAMIN 1000 MCG/ML IJ SOLN
1000.0000 ug | Freq: Once | INTRAMUSCULAR | Status: AC
  Administered 2011-08-17: 1000 ug via INTRAMUSCULAR

## 2011-08-24 ENCOUNTER — Ambulatory Visit (INDEPENDENT_AMBULATORY_CARE_PROVIDER_SITE_OTHER): Payer: Self-pay | Admitting: *Deleted

## 2011-08-24 DIAGNOSIS — E538 Deficiency of other specified B group vitamins: Secondary | ICD-10-CM

## 2011-08-24 MED ORDER — CYANOCOBALAMIN 1000 MCG/ML IJ SOLN
1000.0000 ug | Freq: Once | INTRAMUSCULAR | Status: AC
  Administered 2011-08-24: 1000 ug via INTRAMUSCULAR

## 2011-09-30 ENCOUNTER — Ambulatory Visit (INDEPENDENT_AMBULATORY_CARE_PROVIDER_SITE_OTHER): Payer: Self-pay | Admitting: Family Medicine

## 2011-09-30 DIAGNOSIS — E538 Deficiency of other specified B group vitamins: Secondary | ICD-10-CM

## 2011-09-30 MED ORDER — CYANOCOBALAMIN 1000 MCG/ML IJ SOLN
1000.0000 ug | Freq: Once | INTRAMUSCULAR | Status: AC
  Administered 2011-09-30: 1000 ug via INTRAMUSCULAR

## 2011-09-30 NOTE — Progress Notes (Signed)
  Subjective:    Patient ID: Rodney [name], male    DOB: 19-Feb-1966, 45 y.o.   MRN: [id number]  HPI    Review of Systems     Objective:   Physical Exam        Assessment & Plan:

## 2011-10-29 ENCOUNTER — Other Ambulatory Visit: Payer: Self-pay | Admitting: Family Medicine

## 2011-11-04 ENCOUNTER — Ambulatory Visit (INDEPENDENT_AMBULATORY_CARE_PROVIDER_SITE_OTHER): Payer: Self-pay | Admitting: *Deleted

## 2011-11-04 DIAGNOSIS — E539 Vitamin B deficiency, unspecified: Secondary | ICD-10-CM

## 2011-11-04 MED ORDER — CYANOCOBALAMIN 1000 MCG/ML IJ SOLN
1000.0000 ug | Freq: Once | INTRAMUSCULAR | Status: AC
  Administered 2011-11-04: 1000 ug via INTRAMUSCULAR

## 2011-12-29 ENCOUNTER — Other Ambulatory Visit: Payer: Self-pay | Admitting: Family Medicine

## 2012-02-09 ENCOUNTER — Ambulatory Visit (INDEPENDENT_AMBULATORY_CARE_PROVIDER_SITE_OTHER): Payer: Self-pay | Admitting: *Deleted

## 2012-02-09 DIAGNOSIS — E539 Vitamin B deficiency, unspecified: Secondary | ICD-10-CM

## 2012-02-09 MED ORDER — CYANOCOBALAMIN 1000 MCG/ML IJ SOLN
1000.0000 ug | Freq: Once | INTRAMUSCULAR | Status: AC
  Administered 2012-02-09: 1000 ug via INTRAMUSCULAR

## 2012-03-05 ENCOUNTER — Other Ambulatory Visit: Payer: Self-pay | Admitting: Family Medicine

## 2012-05-11 ENCOUNTER — Other Ambulatory Visit: Payer: Self-pay | Admitting: Family Medicine

## 2012-05-11 MED ORDER — VERAPAMIL HCL 120 MG PO TABS: ORAL_TABLET | ORAL | Status: DC

## 2012-05-11 NOTE — Addendum Note (Signed)
Addended by: Arnette Norris on: 05/11/2012 04:54 PM   Modules accepted: Orders

## 2012-05-11 NOTE — Telephone Encounter (Signed)
Last seen 09/30/11 and filled 03/05/12 #90. Please advise    KP

## 2012-05-11 NOTE — Telephone Encounter (Signed)
Refill x1---- but call pt for ov

## 2012-05-12 ENCOUNTER — Ambulatory Visit (INDEPENDENT_AMBULATORY_CARE_PROVIDER_SITE_OTHER): Payer: Self-pay

## 2012-05-12 DIAGNOSIS — D518 Other vitamin B12 deficiency anemias: Secondary | ICD-10-CM

## 2012-05-12 MED ORDER — CYANOCOBALAMIN 1000 MCG/ML IJ SOLN
1000.0000 ug | Freq: Once | INTRAMUSCULAR | Status: AC
  Administered 2012-05-12: 1000 ug via INTRAMUSCULAR

## 2012-06-09 ENCOUNTER — Ambulatory Visit (INDEPENDENT_AMBULATORY_CARE_PROVIDER_SITE_OTHER): Payer: Self-pay | Admitting: *Deleted

## 2012-06-09 DIAGNOSIS — E538 Deficiency of other specified B group vitamins: Secondary | ICD-10-CM

## 2012-06-09 MED ORDER — CYANOCOBALAMIN 1000 MCG/ML IJ SOLN
1000.0000 ug | Freq: Once | INTRAMUSCULAR | Status: AC
  Administered 2012-06-09: 1000 ug via INTRAMUSCULAR

## 2012-06-13 ENCOUNTER — Telehealth: Payer: Self-pay | Admitting: Family Medicine

## 2012-06-13 NOTE — Telephone Encounter (Signed)
Pt., stopped by check out and now that I have finally gotten his billing issues resolved, patient would like to know if he can start giving himself B12 injections? Patient is self pay and has no insurance and it is getting costly for him to continue with the injections here. Please review and advise  Patient cb# [phone] FYI I am going to call him today with his billing resolutions as well

## 2012-06-13 NOTE — Telephone Encounter (Signed)
Please advise      KP 

## 2012-06-13 NOTE — Telephone Encounter (Signed)
Yes ---he can be taught to give himself SQ injections

## 2012-06-13 NOTE — Telephone Encounter (Signed)
msg left to call the office     KP 

## 2012-06-20 ENCOUNTER — Other Ambulatory Visit: Payer: Self-pay | Admitting: Family Medicine

## 2012-06-21 MED ORDER — "SYRINGE/NEEDLE (DISP) 25G X 5/8"" 1 ML MISC": 1.0000 mL | Status: AC

## 2012-06-21 MED ORDER — CYANOCOBALAMIN 1000 MCG/ML IJ SOLN: 1000.0000 ug | INTRAMUSCULAR | Status: AC

## 2012-06-21 NOTE — Telephone Encounter (Signed)
Discussed with patient and he voiced understanding, Rx sent and he will be in on Friday for training.      KP

## 2012-06-23 ENCOUNTER — Ambulatory Visit (INDEPENDENT_AMBULATORY_CARE_PROVIDER_SITE_OTHER): Payer: Self-pay | Admitting: *Deleted

## 2012-06-23 DIAGNOSIS — E538 Deficiency of other specified B group vitamins: Secondary | ICD-10-CM

## 2012-06-23 NOTE — Progress Notes (Signed)
Patient indicated that his wife would be given him injection. Patient advise to clean area with alcohol then allow to air dry or use cotton ball to wipe away the alcohol to avoid any increase in stinging/burning with needle entry. Patient wife will insert needle in at a 45 degree angle on the back side of the arm. Patient was also given instructions on how to self administer injection in abdomin if wife is not available to give injection. Patient ok info and verbalized understand. Patient was instructed if he or his wife have any concerns or questions about given injection to given [place] a call and we can verbally advise over the phone or if needed can bring them back in for office visit.

## 2012-07-18 ENCOUNTER — Other Ambulatory Visit: Payer: Self-pay | Admitting: Family Medicine

## 2012-08-01 ENCOUNTER — Ambulatory Visit (INDEPENDENT_AMBULATORY_CARE_PROVIDER_SITE_OTHER): Payer: Self-pay | Admitting: Family Medicine

## 2012-08-01 ENCOUNTER — Encounter: Payer: Self-pay | Admitting: Family Medicine

## 2012-08-01 VITALS — BP 138/90 | HR 86 | Temp 98.3°F | Ht 66.0 in | Wt 245.0 lb

## 2012-08-01 DIAGNOSIS — E119 Type 2 diabetes mellitus without complications: Secondary | ICD-10-CM

## 2012-08-01 DIAGNOSIS — E538 Deficiency of other specified B group vitamins: Secondary | ICD-10-CM

## 2012-08-01 DIAGNOSIS — R319 Hematuria, unspecified: Secondary | ICD-10-CM

## 2012-08-01 DIAGNOSIS — I10 Essential (primary) hypertension: Secondary | ICD-10-CM

## 2012-08-01 DIAGNOSIS — E669 Obesity, unspecified: Secondary | ICD-10-CM | POA: Insufficient documentation

## 2012-08-01 DIAGNOSIS — E785 Hyperlipidemia, unspecified: Secondary | ICD-10-CM

## 2012-08-01 DIAGNOSIS — M109 Gout, unspecified: Secondary | ICD-10-CM

## 2012-08-01 DIAGNOSIS — Z0000 Encounter for general adult medical examination without abnormal findings: Secondary | ICD-10-CM

## 2012-08-01 LAB — PSA: PSA: 0.41 ng/mL (ref 0.10–4.00)

## 2012-08-01 LAB — LIPID PANEL
Cholesterol: 190 mg/dL (ref 0–200)
Total CHOL/HDL Ratio: 5
Triglycerides: 225 mg/dL — ABNORMAL HIGH (ref 0.0–149.0)

## 2012-08-01 LAB — POCT URINALYSIS DIPSTICK
Bilirubin, UA: NEGATIVE
Glucose, UA: NEGATIVE
Ketones, UA: NEGATIVE
Leukocytes, UA: NEGATIVE
Nitrite, UA: NEGATIVE
pH, UA: 6.5

## 2012-08-01 LAB — HEPATIC FUNCTION PANEL
ALT: 15 U/L (ref 0–53)
AST: 21 U/L (ref 0–37)
Albumin: 3.5 g/dL (ref 3.5–5.2)
Total Protein: 6.9 g/dL (ref 6.0–8.3)

## 2012-08-01 LAB — CBC WITH DIFFERENTIAL/PLATELET
Basophils Relative: 0.9 % (ref 0.0–3.0)
Eosinophils Absolute: 0.7 10*3/uL (ref 0.0–0.7)
Eosinophils Relative: 5.6 % — ABNORMAL HIGH (ref 0.0–5.0)
HCT: 45.7 % (ref 39.0–52.0)
Lymphs Abs: 2.1 10*3/uL (ref 0.7–4.0)
MCHC: 32 g/dL (ref 30.0–36.0)
MCV: 80.8 fl (ref 78.0–100.0)
Monocytes Absolute: 1 10*3/uL (ref 0.1–1.0)
Neutro Abs: 8.5 10*3/uL — ABNORMAL HIGH (ref 1.4–7.7)
RBC: 5.67 Mil/uL (ref 4.22–5.81)
WBC: 12.3 10*3/uL — ABNORMAL HIGH (ref 4.5–10.5)

## 2012-08-01 LAB — BASIC METABOLIC PANEL
CO2: 23 mEq/L (ref 19–32)
Chloride: 106 mEq/L (ref 96–112)
Creatinine, Ser: 1.6 mg/dL — ABNORMAL HIGH (ref 0.4–1.5)
Potassium: 3.8 mEq/L (ref 3.5–5.1)

## 2012-08-01 LAB — LDL CHOLESTEROL, DIRECT: Direct LDL: 123.7 mg/dL

## 2012-08-01 LAB — MICROALBUMIN / CREATININE URINE RATIO: Microalb Creat Ratio: 270.3 mg/g — ABNORMAL HIGH (ref 0.0–30.0)

## 2012-08-01 LAB — TSH: TSH: 3.96 u[IU]/mL (ref 0.35–5.50)

## 2012-08-01 MED ORDER — LISINOPRIL 40 MG PO TABS: 40.0000 mg | ORAL_TABLET | Freq: Every day | ORAL | Status: DC

## 2012-08-01 MED ORDER — VERAPAMIL HCL 120 MG PO TABS: ORAL_TABLET | ORAL | Status: DC

## 2012-08-01 NOTE — Assessment & Plan Note (Signed)
Been diet controlled up until now Check labs  Pt given ed material to help with diet

## 2012-08-01 NOTE — Progress Notes (Signed)
Subjective:    Patient ID: Rodney [name], male    DOB: 05-Jun-1966, 46 y.o.   MRN: [id number]  HPI Pt here for cpe and labs.  Pt has noticed his bp has been running high at home.  No HA, cp ,sOb.  Etc.    Pt refuses flu shot.    No other complaints.  He is not regularly checking bs.  Past Medical History  Diagnosis Date  . Gout   . Hyperlipidemia   . Hypertension   . Diabetes mellitus     Type 2   History   Social History  . Marital Status: Married    Spouse Name: N/A    Number of Children: N/A  . Years of Education: N/A   Occupational History  . self employed    Social History Main Topics  . Smoking status: Never Smoker   . Smokeless tobacco: Never Used  . Alcohol Use: 4.2 oz/week    7 Cans of beer per week  . Drug Use: No  . Sexually Active: Yes -- Male partner(s)   Other Topics Concern  . Not on file   Social History Narrative   Exercise--no   Family History  Problem Relation Age of Onset  . Heart disease Mother 27    CHF, stent,   . Cancer Mother 6    breast  . Stroke Father   . Hypertension Father   . Hyperlipidemia Father   . Heart disease Maternal Aunt   . Heart disease Maternal Uncle    Current Outpatient Prescriptions on File Prior to Visit  Medication Sig Dispense Refill  . aspirin 81 MG tablet Take 81 mg by mouth daily.        Marland Kitchen atenolol (TENORMIN) 100 MG tablet 1/2 tablet by mouth daily (office visit due now)  90 tablet  0  . cyanocobalamin (,VITAMIN B-12,) 1000 MCG/ML injection Inject 1 mL (1,000 mcg total) into the skin every 30 (thirty) days.  10 mL  0  . naproxen (NAPROSYN) 250 MG tablet Take 3 tabs for 1st dose then 1 tab every 8 hours as needed  30 tablet  0  . Omega-3 Fatty Acids (FISH OIL) 1000 MG CAPS Take 2 capsules by mouth 2 (two) times daily.        . SYRINGE/NEEDLE, DISP, 1 ML (BD ECLIPSE SYRINGE) 25G X 5/8" 1 ML MISC 1 mL by Does not apply route every 30 (thirty) days.  50 each  0  . DISCONTD: verapamil (CALAN) 120 MG tablet  1.5 tab by mouth daily(office visit due now)  90 tablet  0   Current Facility-Administered Medications on File Prior to Visit  Medication Dose Route Frequency Provider Last Rate Last Dose  . cyanocobalamin ((VITAMIN B-12)) injection 1,000 mcg  1,000 mcg Intramuscular Once Lelon Perla, DO      . DISCONTD: TDaP (BOOSTRIX) injection 0.5 mL  0.5 mL Intramuscular Once Lelon Perla, DO       Allergies  Allergen Reactions  . Simvastatin     REACTION: muscle aches     Review of Systems Review of Systems  Constitutional: Negative for activity change, appetite change and fatigue.  HENT: Negative for hearing loss, congestion, tinnitus and ear discharge.  dentist--not regular Eyes: Negative for visual disturbance (see optho q1y due)  Respiratory: Negative for cough, chest tightness and shortness of breath.   Cardiovascular: Negative for chest pain, palpitations and leg swelling.  Gastrointestinal: Negative for abdominal pain, diarrhea, constipation and abdominal distention.  Genitourinary: Negative for urgency, frequency, decreased urine volume and difficulty urinating.  Musculoskeletal: Negative for back pain, arthralgias and gait problem.  Skin: Negative for color change, pallor and rash.  Neurological: Negative for dizziness, light-headedness, numbness and headaches.  Hematological: Negative for adenopathy. Does not bruise/bleed easily.  Psychiatric/Behavioral: Negative for suicidal ideas, confusion, sleep disturbance, self-injury, dysphoric mood, decreased concentration and agitation.         Objective:   Physical Exam BP 138/90  Pulse 86  Temp 98.3 F (36.8 C) (Oral)  Ht 5\' 6"  (1.676 m)  Wt 245 lb (111.131 kg)  BMI 39.54 kg/m2  SpO2 97%  General Appearance:    Alert, cooperative, no distress, appears stated age  Head:    Normocephalic, without obvious abnormality, atraumatic  Eyes:    PERRL, conjunctiva/corneas clear, EOM's intact, fundi    benign, both eyes       Ears:     Normal TM's and external ear canals, both ears  Nose:   Nares normal, septum midline, mucosa normal, no drainage   or sinus tenderness  Throat:   Lips, mucosa, and tongue normal; teeth and gums normal  Neck:   Supple, symmetrical, trachea midline, no adenopathy;       thyroid:  No enlargement/tenderness/nodules; no carotid   bruit or JVD  Back:     Symmetric, no curvature, ROM normal, no CVA tenderness  Lungs:     Clear to auscultation bilaterally, respirations unlabored  Chest wall:    No tenderness or deformity  Heart:    Regular rate and rhythm, S1 and S2 normal, no murmur, rub   or gallop  Abdomen:     Soft, non-tender, bowel sounds active all four quadrants,    no masses, no organomegaly  Genitalia:    Normal male without lesion, discharge or tenderness  Rectal:    Normal tone, normal prostate, no masses or tenderness;   guaiac negative stool  Extremities:   Extremities normal, atraumatic, no cyanosis or edema  Pulses:   2+ and symmetric all extremities  Skin:   Skin color, texture, turgor normal, no rashes or lesions  Lymph nodes:   Cervical, supraclavicular, and axillary nodes normal  Neurologic:   CNII-XII intact. Normal strength, sensation and reflexes      throughout          Assessment & Plan:  cpe--- ghm ud             Check labs

## 2012-08-01 NOTE — Assessment & Plan Note (Signed)
On no meds Check labs  

## 2012-08-01 NOTE — Assessment & Plan Note (Signed)
Has had no problems with it Check labs

## 2012-08-01 NOTE — Patient Instructions (Signed)
Preventive Care for Adults, Male A healthy lifestyle and preventative care can promote health and wellness. Preventative health guidelines for men include the following key practices:  A routine yearly physical is a good way to check with your caregiver about your health and preventative screening. It is a chance to share any concerns and updates on your health, and to receive a thorough exam.   Visit your dentist for a routine exam and preventative care every 6 months. Brush your teeth twice a day and floss once a day. Good oral hygiene prevents tooth decay and gum disease.   The frequency of eye exams is based on your age, health, family medical history, use of contact lenses, and other factors. Follow your caregiver's recommendations for frequency of eye exams.   Eat a healthy diet. Foods like vegetables, fruits, whole grains, low-fat dairy products, and lean protein foods contain the nutrients you need without too many calories. Decrease your intake of foods high in solid fats, added sugars, and salt. Eat the right amount of calories for you.Get information about a proper diet from your caregiver, if necessary.   Regular physical exercise is one of the most important things you can do for your health. Most adults should get at least 150 minutes of moderate-intensity exercise (any activity that increases your heart rate and causes you to sweat) each week. In addition, most adults need muscle-strengthening exercises on 2 or more days a week.   Maintain a healthy weight. The body mass index (BMI) is a screening tool to identify possible weight problems. It provides an estimate of body fat based on height and weight. Your caregiver can help determine your BMI, and can help you achieve or maintain a healthy weight.For adults 20 years and older:   A BMI below 18.5 is considered underweight.   A BMI of 18.5 to 24.9 is normal.   A BMI of 25 to 29.9 is considered overweight.   A BMI of 30 and above  is considered obese.   Maintain normal blood lipids and cholesterol levels by exercising and minimizing your intake of saturated fat. Eat a balanced diet with plenty of fruit and vegetables. Blood tests for lipids and cholesterol should begin at age 20 and be repeated every 5 years. If your lipid or cholesterol levels are high, you are over 50, or you are a high risk for heart disease, you may need your cholesterol levels checked more frequently.Ongoing high lipid and cholesterol levels should be treated with medicines if diet and exercise are not effective.   If you smoke, find out from your caregiver how to quit. If you do not use tobacco, do not start.   If you choose to drink alcohol, do not exceed 2 drinks per day. One drink is considered to be 12 ounces (355 mL) of beer, 5 ounces (148 mL) of wine, or 1.5 ounces (44 mL) of liquor.   Avoid use of street drugs. Do not share needles with anyone. Ask for help if you need support or instructions about stopping the use of drugs.   High blood pressure causes heart disease and increases the risk of stroke. Your blood pressure should be checked at least every 1 to 2 years. Ongoing high blood pressure should be treated with medicines, if weight loss and exercise are not effective.   If you are 45 to 48 years old, ask your caregiver if you should take aspirin to prevent heart disease.   Diabetes screening involves taking a blood   sample to check your fasting blood sugar level. This should be done once every 3 years, after age 45, if you are within normal weight and without risk factors for diabetes. Testing should be considered at a younger age or be carried out more frequently if you are overweight and have at least 1 risk factor for diabetes.   Colorectal cancer can be detected and often prevented. Most routine colorectal cancer screening begins at the age of 50 and continues through age 75. However, your caregiver may recommend screening at an earlier  age if you have risk factors for colon cancer. On a yearly basis, your caregiver may provide home test kits to check for hidden blood in the stool. Use of a small camera at the end of a tube, to directly examine the colon (sigmoidoscopy or colonoscopy), can detect the earliest forms of colorectal cancer. Talk to your caregiver about this at age 50, when routine screening begins. Direct examination of the colon should be repeated every 5 to 10 years through age 75, unless early forms of pre-cancerous polyps or small growths are found.   Hepatitis C blood testing is recommended for all people born from 1945 through 1965 and any individual with known risks for hepatitis C.   Practice safe sex. Use condoms and avoid high-risk sexual practices to reduce the spread of sexually transmitted infections (STIs). STIs include gonorrhea, chlamydia, syphilis, trichomonas, herpes, HPV, and human immunodeficiency virus (HIV). Herpes, HIV, and HPV are viral illnesses that have no cure. They can result in disability, cancer, and death.   A one-time screening for abdominal aortic aneurysm (AAA) and surgical repair of large AAAs by sound wave imaging (ultrasonography) is recommended for ages 65 to 75 years who are current or former smokers.   Healthy men should no longer receive prostate-specific antigen (PSA) blood tests as part of routine cancer screening. Consult with your caregiver about prostate cancer screening.   Testicular cancer screening is not recommended for adult males who have no symptoms. Screening includes self-exam, caregiver exam, and other screening tests. Consult with your caregiver about any symptoms you have or any concerns you have about testicular cancer.   Use sunscreen with skin protection factor (SPF) of 30 or more. Apply sunscreen liberally and repeatedly throughout the day. You should seek shade when your shadow is shorter than you. Protect yourself by wearing long sleeves, pants, a  wide-brimmed hat, and sunglasses year round, whenever you are outdoors.   Once a month, do a whole body skin exam, using a mirror to look at the skin on your back. Notify your caregiver of new moles, moles that have irregular borders, moles that are larger than a pencil eraser, or moles that have changed in shape or color.   Stay current with required immunizations.   Influenza. You need a dose every fall (or winter). The composition of the flu vaccine changes each year, so being vaccinated once is not enough.   Pneumococcal polysaccharide. You need 1 to 2 doses if you smoke cigarettes or if you have certain chronic medical conditions. You need 1 dose at age 65 (or older) if you have never been vaccinated.   Tetanus, diphtheria, pertussis (Tdap, Td). Get 1 dose of Tdap vaccine if you are younger than age 65 years, are over 65 and have contact with an infant, are a healthcare worker, or simply want to be protected from whooping cough. After that, you need a Td booster dose every 10 years. Consult your caregiver if   you have not had at least 3 tetanus and diphtheria-containing shots sometime in your life or have a deep or dirty wound.   HPV. This vaccine is recommended for males 13 through 48 years of age. This vaccine may be given to men 22 through 48 years of age who have not completed the 3 dose series. It is recommended for men through age 26 who have sex with men or whose immune system is weakened because of HIV infection, other illness, or medications. The vaccine is given in 3 doses over 6 months.   Measles, mumps, rubella (MMR). You need at least 1 dose of MMR if you were born in 1957 or later. You may also need a 2nd dose.   Meningococcal. If you are age 19 to 21 years and a first-year college student living in a residence hall, or have one of several medical conditions, you need to get vaccinated against meningococcal disease. You may also need additional booster doses.   Zoster (shingles).  If you are age 60 years or older, you should get this vaccine.   Varicella (chickenpox). If you have never had chickenpox or you were vaccinated but received only 1 dose, talk to your caregiver to find out if you need this vaccine.   Hepatitis A. You need this vaccine if you have a specific risk factor for hepatitis A virus infection, or you simply wish to be protected from this disease. The vaccine is usually given as 2 doses, 6 to 18 months apart.   Hepatitis B. You need this vaccine if you have a specific risk factor for hepatitis B virus infection or you simply wish to be protected from this disease. The vaccine is given in 3 doses, usually over 6 months.  Preventative Service / Frequency Ages 19 to 39  Blood pressure check.** / Every 1 to 2 years.   Lipid and cholesterol check.** / Every 5 years beginning at age 20.   Hepatitis C blood test.** / For any individual with known risks for hepatitis C.   Skin self-exam. / Monthly.   Influenza immunization.** / Every year.   Pneumococcal polysaccharide immunization.** / 1 to 2 doses if you smoke cigarettes or if you have certain chronic medical conditions.   Tetanus, diphtheria, pertussis (Tdap,Td) immunization. / A one-time dose of Tdap vaccine. After that, you need a Td booster dose every 10 years.   HPV immunization. / 3 doses over 6 months, if 26 and younger.   Measles, mumps, rubella (MMR) immunization. / You need at least 1 dose of MMR if you were born in 1957 or later. You may also need a 2nd dose.   Meningococcal immunization. / 1 dose if you are age 19 to 21 years and a first-year college student living in a residence hall, or have one of several medical conditions, you need to get vaccinated against meningococcal disease. You may also need additional booster doses.   Varicella immunization.** / Consult your caregiver.   Hepatitis A immunization.** / Consult your caregiver. 2 doses, 6 to 18 months apart.   Hepatitis B  immunization.** / Consult your caregiver. 3 doses usually over 6 months.  Ages 40 to 64  Blood pressure check.** / Every 1 to 2 years.   Lipid and cholesterol check.** / Every 5 years beginning at age 20.   Fecal occult blood test (FOBT) of stool. / Every year beginning at age 50 and continuing until age 75. You may not have to do this test if   you get colonoscopy every 10 years.   Flexible sigmoidoscopy** or colonoscopy.** / Every 5 years for a flexible sigmoidoscopy or every 10 years for a colonoscopy beginning at age 50 and continuing until age 75.   Hepatitis C blood test.** / For all people born from 1945 through 1965 and any individual with known risks for hepatitis C.   Skin self-exam. / Monthly.   Influenza immunization.** / Every year.   Pneumococcal polysaccharide immunization.** / 1 to 2 doses if you smoke cigarettes or if you have certain chronic medical conditions.   Tetanus, diphtheria, pertussis (Tdap/Td) immunization.** / A one-time dose of Tdap vaccine. After that, you need a Td booster dose every 10 years.   Measles, mumps, rubella (MMR) immunization. / You need at least 1 dose of MMR if you were born in 1957 or later. You may also need a 2nd dose.   Varicella immunization.**/ Consult your caregiver.   Meningococcal immunization.** / Consult your caregiver.   Hepatitis A immunization.** / Consult your caregiver. 2 doses, 6 to 18 months apart.   Hepatitis B immunization.** / Consult your caregiver. 3 doses, usually over 6 months.  Ages 65 and over  Blood pressure check.** / Every 1 to 2 years.   Lipid and cholesterol check.**/ Every 5 years beginning at age 20.   Fecal occult blood test (FOBT) of stool. / Every year beginning at age 50 and continuing until age 75. You may not have to do this test if you get colonoscopy every 10 years.   Flexible sigmoidoscopy** or colonoscopy.** / Every 5 years for a flexible sigmoidoscopy or every 10 years for a colonoscopy  beginning at age 50 and continuing until age 75.   Hepatitis C blood test.** / For all people born from 1945 through 1965 and any individual with known risks for hepatitis C.   Abdominal aortic aneurysm (AAA) screening.** / A one-time screening for ages 65 to 75 years who are current or former smokers.   Skin self-exam. / Monthly.   Influenza immunization.** / Every year.   Pneumococcal polysaccharide immunization.** / 1 dose at age 65 (or older) if you have never been vaccinated.   Tetanus, diphtheria, pertussis (Tdap, Td) immunization. / A one-time dose of Tdap vaccine if you are over 65 and have contact with an infant, are a healthcare worker, or simply want to be protected from whooping cough. After that, you need a Td booster dose every 10 years.   Varicella immunization. ** / Consult your caregiver.   Meningococcal immunization.** / Consult your caregiver.   Hepatitis A immunization. ** / Consult your caregiver. 2 doses, 6 to 18 months apart.   Hepatitis B immunization.** / Check with your caregiver. 3 doses, usually over 6 months.  **Family history and personal history of risk and conditions may change your caregiver's recommendations. Document Released: 12/28/2001 Document Revised: 10/21/2011 Document Reviewed: 03/29/2011 Exit[employer] Patient Information 2012 [employer]. 

## 2012-08-01 NOTE — Assessment & Plan Note (Signed)
D/w pt diet and exercise  

## 2012-08-01 NOTE — Assessment & Plan Note (Signed)
b12 deficiency Check labs con't injections---pt states he feels much better with them

## 2012-08-01 NOTE — Assessment & Plan Note (Signed)
Elevate today Inc lisinopril to 40 mg  Recheck 2-3 weeks

## 2012-08-01 NOTE — Addendum Note (Signed)
Addended by: Silvio Pate D on: 08/01/2012 10:18 AM   Modules accepted: Orders

## 2012-08-03 LAB — URINE CULTURE: Organism ID, Bacteria: NO GROWTH

## 2012-08-09 ENCOUNTER — Telehealth: Payer: Self-pay

## 2012-08-09 MED ORDER — METFORMIN HCL ER 500 MG PO TB24: 500.0000 mg | ORAL_TABLET | Freq: Every day | ORAL | Status: DC

## 2012-08-09 NOTE — Telephone Encounter (Signed)
Message copied by Arnette Norris on Wed Aug 09, 2012  4:26 PM ------      Message from: Lelon Perla      Created: Tue Aug 01, 2012  8:42 PM       DM not controlled---start Glucophage xr 500 mg  #30  1po qhs , 2 refills       Cholesterol--- LDL goal < 70,  HDL >40,  TG < 150.  Diet and exercise will increase HDL and decrease LDL and TG.  Fish,  Fish Oil, Flaxseed oil will also help increase the HDL and decrease Triglycerides.   Recheck labs in 3 months.      -----lipitor 10 mg  #30  1 po qhs ,  2 refills      Increased protein in urine--------need 24 hr urine for protein      272.4 ,  250.00  Lipid, hep, bmp, hgba1c, microalbumin             If pt does not have glucometer we need him to come in for glucometer--- recommend nutritionist referral

## 2012-08-09 NOTE — Telephone Encounter (Signed)
Spoke with patient and he voiced understanding of labs, he said he will not take the Lipitor because he took it in the past and it cause really bad pain. He agreed to watch his diet and declined the nutrition referral. Patient education material and Rx faxed     KP

## 2012-08-10 NOTE — Telephone Encounter (Signed)
Still recheck 3 months 

## 2013-03-01 ENCOUNTER — Other Ambulatory Visit: Payer: Self-pay | Admitting: Family Medicine

## 2013-06-07 ENCOUNTER — Telehealth: Payer: Self-pay | Admitting: Family Medicine

## 2013-06-07 NOTE — Telephone Encounter (Signed)
Patient has been doing his B-12 at home and just took his last one today.  His wife is calling to inquire about whether he needs an appointment before his next refill.

## 2013-06-08 NOTE — Telephone Encounter (Signed)
Patient is due for an Office visit. Apt scheduled     KP

## 2013-06-25 ENCOUNTER — Ambulatory Visit (INDEPENDENT_AMBULATORY_CARE_PROVIDER_SITE_OTHER): Payer: Self-pay | Admitting: Family Medicine

## 2013-06-25 ENCOUNTER — Encounter: Payer: Self-pay | Admitting: Family Medicine

## 2013-06-25 ENCOUNTER — Telehealth: Payer: Self-pay | Admitting: Family Medicine

## 2013-06-25 VITALS — BP 180/108 | HR 66 | Temp 98.7°F | Wt 240.8 lb

## 2013-06-25 DIAGNOSIS — E538 Deficiency of other specified B group vitamins: Secondary | ICD-10-CM

## 2013-06-25 DIAGNOSIS — IMO0001 Reserved for inherently not codable concepts without codable children: Secondary | ICD-10-CM

## 2013-06-25 DIAGNOSIS — Z23 Encounter for immunization: Secondary | ICD-10-CM

## 2013-06-25 DIAGNOSIS — R200 Anesthesia of skin: Secondary | ICD-10-CM

## 2013-06-25 DIAGNOSIS — N390 Urinary tract infection, site not specified: Secondary | ICD-10-CM

## 2013-06-25 DIAGNOSIS — E785 Hyperlipidemia, unspecified: Secondary | ICD-10-CM

## 2013-06-25 DIAGNOSIS — E119 Type 2 diabetes mellitus without complications: Secondary | ICD-10-CM

## 2013-06-25 DIAGNOSIS — R209 Unspecified disturbances of skin sensation: Secondary | ICD-10-CM

## 2013-06-25 DIAGNOSIS — I10 Essential (primary) hypertension: Secondary | ICD-10-CM

## 2013-06-25 LAB — HEPATIC FUNCTION PANEL
ALT: 15 U/L (ref 0–53)
Alkaline Phosphatase: 108 U/L (ref 39–117)
Bilirubin, Direct: 0 mg/dL (ref 0.0–0.3)
Total Protein: 7.5 g/dL (ref 6.0–8.3)

## 2013-06-25 LAB — CBC WITH DIFFERENTIAL/PLATELET
Basophils Relative: 0.7 % (ref 0.0–3.0)
Eosinophils Relative: 5.3 % — ABNORMAL HIGH (ref 0.0–5.0)
Hemoglobin: 14.8 g/dL (ref 13.0–17.0)
Lymphocytes Relative: 16 % (ref 12.0–46.0)
MCV: 82.8 fl (ref 78.0–100.0)
Neutrophils Relative %: 70.2 % (ref 43.0–77.0)
RBC: 5.35 Mil/uL (ref 4.22–5.81)
WBC: 12.1 10*3/uL — ABNORMAL HIGH (ref 4.5–10.5)

## 2013-06-25 LAB — BASIC METABOLIC PANEL
Calcium: 8.7 mg/dL (ref 8.4–10.5)
Chloride: 102 mEq/L (ref 96–112)
Creatinine, Ser: 1.9 mg/dL — ABNORMAL HIGH (ref 0.4–1.5)
Sodium: 134 mEq/L — ABNORMAL LOW (ref 135–145)

## 2013-06-25 LAB — LDL CHOLESTEROL, DIRECT: Direct LDL: 127.8 mg/dL

## 2013-06-25 LAB — VITAMIN B12: Vitamin B-12: 231 pg/mL (ref 211–911)

## 2013-06-25 LAB — HEMOGLOBIN A1C: Hgb A1c MFr Bld: 6.5 % (ref 4.6–6.5)

## 2013-06-25 MED ORDER — LOSARTAN POTASSIUM 100 MG PO TABS: 100.0000 mg | ORAL_TABLET | Freq: Every day | ORAL | Status: DC

## 2013-06-25 MED ORDER — VALSARTAN 160 MG PO TABS: 160.0000 mg | ORAL_TABLET | Freq: Every day | ORAL | Status: DC

## 2013-06-25 MED ORDER — CYANOCOBALAMIN 1000 MCG/ML IJ SOLN: 1000.0000 ug | Freq: Once | INTRAMUSCULAR | Status: DC

## 2013-06-25 NOTE — Telephone Encounter (Signed)
Please advise      KP 

## 2013-06-25 NOTE — Patient Instructions (Signed)
Diabetes and Standards of Medical Care  Diabetes is complicated. You may find that your diabetes team includes a dietitian, nurse, diabetes educator, eye doctor, and more. To help everyone know what is going on and to help you get the care you deserve, the following schedule of care was developed to help keep you on track. Below are the tests, exams, vaccines, medicines, education, and plans you will need. A1c test  Performed at least 2 times a year if you are meeting treatment goals.  Performed 4 times a year if therapy has changed or if you are not meeting treatment goals. Blood pressure test  Performed at every routine medical visit. The goal is less than 120/80 mmHg. Dental exam  Follow up with the dentist regularly. Eye exam  Diagnosed with type 1 diabetes as a child: Get an exam upon reaching the age of 10 years or older and having had diabetes for 3 5 years. Yearly eye exams are recommended after that initial eye exam.  Diagnosed with type 1 diabetes as an adult: Get an exam within 5 years of diagnosis and then yearly.  Diagnosed with type 2 diabetes: Get an exam as soon as possible after the diagnosis and then yearly. Foot care exam  Visual foot exams are performed at every routine medical visit. The exams check for cuts, injuries, or other problems with the feet.  A comprehensive foot exam should be done yearly. This includes visual inspection as well as assessing foot pulses and testing for loss of sensation. Kidney function test (urine microalbumin)  Performed once a year.  Type 1 diabetes: The first test is performed 5 years after diagnosis.  Type 2 diabetes: The first test is performed at the time of diagnosis.  A serum creatinine and estimated glomerular filtration rate (eGFR) test is done once a year to tell the level of chronic kidney disease (CKD), if present. Lipid profile (Cholesterol, HDL, LDL, Triglycerides)  Performed every 5 years for most people.  The  goal for LDL is less than 100 mg/dl. If at high risk, the goal is less than 70 mg/dl.  The goal for HDL is 40 mg/dl 50 mg/dl for men and 50 mg/dl 60 mg/dl for women. An HDL cholesterol of 60 mg/dL or higher gives some protection against heart disease.  The goal for triglycerides is less than 150 mg/dl. Influenza vaccine, pneumococcal vaccine, and hepatitis B vaccine  The influenza vaccine is recommended yearly.  The pneumococcal vaccine is generally given once in a lifetime. However, there are some instances when another vaccination is recommended. Check with your caregiver.  The hepatitis B vaccine is also recommended for adults with diabetes. Diabetes self-management education  Recommended at diagnosis and ongoing as needed. Treatment plan  Reviewed at every medical visit. Document Released: 08/29/2009 Document Revised: 10/18/2012 Document Reviewed: 05/04/2011 ExitCare Patient Information 2014 [employer].  

## 2013-06-25 NOTE — Assessment & Plan Note (Addendum)
Elevated today-- pt is taking meds D/c lisinopril diovan 160 mg 1 po qd rto 2-3 weeks or sooner prn

## 2013-06-25 NOTE — Assessment & Plan Note (Signed)
con't meds Check labs today 

## 2013-06-25 NOTE — Assessment & Plan Note (Signed)
Check b12 today. 

## 2013-06-25 NOTE — Assessment & Plan Note (Signed)
Check labs today.

## 2013-06-25 NOTE — Progress Notes (Signed)
Subjective:    Patient ID: Rodney [name], male    DOB: 1966-10-04, 47 y.o.   MRN: [id number]  HPI HYPERTENSION Disease Monitoring Blood pressure range-not checked at home Chest pain- no      Dyspnea- no Medications Compliance- good Lightheadedness- no   Edema- no   DIABETES Disease Monitoring Blood Sugar ranges-109-- only check 1-2 x a month Polyuria- no New Visual problems- no Medications Compliance- good Hypoglycemic symptoms- no   HYPERLIPIDEMIA Disease Monitoring See symptoms for Hypertension Medications Compliance- good RUQ pain- no  Muscle aches- no  Pt also needs f/u B12 deficiency--  Numbness in hands and legs gone.  No other complaints.   ROS See HPI above   PMH.   Past Medical History  Diagnosis Date  . Gout   . Hyperlipidemia   . Hypertension   . Diabetes mellitus     Type 2   History   Social History  . Marital Status: Married    Spouse Name: N/A    Number of Children: N/A  . Years of Education: N/A   Occupational History  . self employed    Social History Main Topics  . Smoking status: Never Smoker   . Smokeless tobacco: Never Used  . Alcohol Use: 4.2 oz/week    7 Cans of beer per week  . Drug Use: No  . Sexually Active: Yes -- Male partner(s)   Other Topics Concern  . Not on file   Social History Narrative   Exercise--no   Current outpatient prescriptions:aspirin 81 MG tablet, Take 81 mg by mouth daily.  , Disp: , Rfl: ;  atenolol (TENORMIN) 100 MG tablet, 1/2 tablet by mouth daily (office visit due now), Disp: 90 tablet, Rfl: 0;  metFORMIN (GLUCOPHAGE XR) 500 MG 24 hr tablet, Take 1 tablet (500 mg total) by mouth daily with breakfast., Disp: 30 tablet, Rfl: 2 naproxen (NAPROSYN) 250 MG tablet, Take 3 tabs for 1st dose then 1 tab every 8 hours as needed, Disp: 30 tablet, Rfl: 0;  Omega-3 Fatty Acids (FISH OIL) 1000 MG CAPS, Take 2 capsules by mouth 2 (two) times daily.  , Disp: , Rfl: ;  SYRINGE/NEEDLE, DISP, 1 ML (BD ECLIPSE SYRINGE)  25G X 5/8" 1 ML MISC, 1 mL by Does not apply route every 30 (thirty) days., Disp: 50 each, Rfl: 0 verapamil (CALAN) 120 MG tablet, 1.5 tab by mouth twice daily, Disp: 270 tablet, Rfl: 3;  losartan (COZAAR) 100 MG tablet, Take 1 tablet (100 mg total) by mouth daily., Disp: 30 tablet, Rfl: 2 Current facility-administered medications:cyanocobalamin ((VITAMIN B-12)) injection 1,000 mcg, 1,000 mcg, Intramuscular, Once, [employer], DO;  cyanocobalamin ((VITAMIN B-12)) injection 1,000 mcg, 1,000 mcg, Intramuscular, Once, Lelon Perla, DO  Smoking Status noted     Review of Systems As above     Objective:   Physical Exam BP 180/108  Pulse 66  Temp(Src) 98.7 F (37.1 C) (Oral)  Wt 240 lb 12.8 oz (109.226 kg)  BMI 38.88 kg/m2  SpO2 97% General appearance: alert, cooperative, appears stated age and no distress Eyes: conjunctivae/corneas clear. PERRL, EOM's intact. Fundi benign. Throat: lips, mucosa, and tongue normal; teeth and gums normal Neck: no adenopathy, supple, symmetrical, trachea midline and thyroid not enlarged, symmetric, no tenderness/mass/nodules Lungs: clear to auscultation bilaterally Heart: S1, S2 normal Extremities: extremities normal, atraumatic, no cyanosis or edema Sensory exam of the foot is normal, tested with the monofilament. Good pulses, no lesions or ulcers, good peripheral pulses.  Assessment & Plan:

## 2013-06-25 NOTE — Telephone Encounter (Signed)
Patient's wife called stating the diovan is going to cost $132 for a 30 day supply. They would like to know if there is a less expensive alternative. The patient does not have insurance. Pt sues [facility] on [address]

## 2013-06-25 NOTE — Telephone Encounter (Signed)
Losartan 100mg  #  30  1 po qd , 2 refills

## 2013-06-26 LAB — POCT URINALYSIS DIPSTICK
Bilirubin, UA: NEGATIVE
Glucose, UA: NEGATIVE
Ketones, UA: NEGATIVE
Leukocytes, UA: NEGATIVE
Spec Grav, UA: 1.015

## 2013-06-26 LAB — MICROALBUMIN / CREATININE URINE RATIO
Creatinine,U: 83.5 mg/dL
Microalb Creat Ratio: 316.1 mg/g — ABNORMAL HIGH (ref 0.0–30.0)
Microalb, Ur: 264 mg/dL — ABNORMAL HIGH (ref 0.0–1.9)

## 2013-06-26 NOTE — Addendum Note (Signed)
Addended by: Silvio Pate D on: 06/26/2013 02:19 PM   Modules accepted: Orders

## 2013-06-27 DIAGNOSIS — N289 Disorder of kidney and ureter, unspecified: Secondary | ICD-10-CM

## 2013-06-28 LAB — URINE CULTURE
Colony Count: NO GROWTH
Organism ID, Bacteria: NO GROWTH

## 2013-07-02 ENCOUNTER — Other Ambulatory Visit: Payer: Self-pay

## 2013-07-02 DIAGNOSIS — E538 Deficiency of other specified B group vitamins: Secondary | ICD-10-CM

## 2013-07-02 MED ORDER — ATORVASTATIN CALCIUM 20 MG PO TABS: 20.0000 mg | ORAL_TABLET | Freq: Every day | ORAL | Status: DC

## 2013-07-02 MED ORDER — CYANOCOBALAMIN 1000 MCG/ML IJ SOLN: 1000.0000 ug | Freq: Once | INTRAMUSCULAR | Status: DC

## 2013-08-18 ENCOUNTER — Other Ambulatory Visit: Payer: Self-pay | Admitting: Family Medicine

## 2013-10-19 ENCOUNTER — Other Ambulatory Visit: Payer: Self-pay | Admitting: Family Medicine

## 2013-12-10 ENCOUNTER — Other Ambulatory Visit: Payer: Self-pay | Admitting: Family Medicine

## 2014-01-16 ENCOUNTER — Other Ambulatory Visit: Payer: Self-pay | Admitting: Family Medicine

## 2014-01-28 ENCOUNTER — Telehealth: Payer: Self-pay

## 2014-01-28 MED ORDER — LOSARTAN POTASSIUM 100 MG PO TABS: ORAL_TABLET | ORAL | Status: DC

## 2014-01-28 NOTE — Telephone Encounter (Signed)
Wife called with refill request for Losartan. I made her aware apt due now. Apt scheduled for 02/11/14 at 9 am . Rx called in      [place]

## 2014-02-11 ENCOUNTER — Ambulatory Visit (INDEPENDENT_AMBULATORY_CARE_PROVIDER_SITE_OTHER): Payer: Self-pay | Admitting: Family Medicine

## 2014-02-11 ENCOUNTER — Encounter: Payer: Self-pay | Admitting: Family Medicine

## 2014-02-11 VITALS — BP 140/86 | HR 70 | Temp 98.4°F | Ht 70.0 in | Wt 244.4 lb

## 2014-02-11 DIAGNOSIS — R319 Hematuria, unspecified: Secondary | ICD-10-CM

## 2014-02-11 DIAGNOSIS — I10 Essential (primary) hypertension: Secondary | ICD-10-CM

## 2014-02-11 DIAGNOSIS — E1159 Type 2 diabetes mellitus with other circulatory complications: Secondary | ICD-10-CM

## 2014-02-11 DIAGNOSIS — E785 Hyperlipidemia, unspecified: Secondary | ICD-10-CM

## 2014-02-11 DIAGNOSIS — E669 Obesity, unspecified: Secondary | ICD-10-CM | POA: Insufficient documentation

## 2014-02-11 LAB — LIPID PANEL
CHOLESTEROL: 199 mg/dL (ref 0–200)
HDL: 32 mg/dL — AB (ref >39.00)
LDL Cholesterol: 90 mg/dL (ref 0–99)
TRIGLYCERIDES: 383 mg/dL — AB (ref 0.0–149.0)
Total CHOL/HDL Ratio: 6
VLDL: 76.6 mg/dL — AB (ref 0.0–40.0)

## 2014-02-11 LAB — HEPATIC FUNCTION PANEL
ALBUMIN: 3.6 g/dL (ref 3.5–5.2)
ALT: 19 U/L (ref 0–53)
AST: 15 U/L (ref 0–37)
Alkaline Phosphatase: 102 U/L (ref 39–117)
BILIRUBIN TOTAL: 0.5 mg/dL (ref 0.3–1.2)
Bilirubin, Direct: 0 mg/dL (ref 0.0–0.3)
Total Protein: 7.1 g/dL (ref 6.0–8.3)

## 2014-02-11 LAB — POCT URINALYSIS DIPSTICK
Bilirubin, UA: NEGATIVE
GLUCOSE UA: NEGATIVE
Ketones, UA: NEGATIVE
Leukocytes, UA: NEGATIVE
Nitrite, UA: NEGATIVE
PROTEIN UA: 30
SPEC GRAV UA: 1.025
Urobilinogen, UA: 0.2
pH, UA: 6

## 2014-02-11 LAB — BASIC METABOLIC PANEL
BUN: 30 mg/dL — AB (ref 6–23)
CO2: 24 mEq/L (ref 19–32)
CREATININE: 2.2 mg/dL — AB (ref 0.4–1.5)
Calcium: 9 mg/dL (ref 8.4–10.5)
Chloride: 101 mEq/L (ref 96–112)
GFR: 33.34 mL/min — AB (ref >60.00)
GLUCOSE: 149 mg/dL — AB (ref 70–99)
POTASSIUM: 3.8 meq/L (ref 3.5–5.1)
Sodium: 132 mEq/L — ABNORMAL LOW (ref 135–145)

## 2014-02-11 LAB — MICROALBUMIN / CREATININE URINE RATIO
CREATININE, U: 127.2 mg/dL
MICROALB/CREAT RATIO: 229.8 mg/g — AB (ref 0.0–30.0)

## 2014-02-11 LAB — HM DIABETES FOOT EXAM

## 2014-02-11 LAB — HEMOGLOBIN A1C: HEMOGLOBIN A1C: 7 % — AB (ref 4.6–6.5)

## 2014-02-11 MED ORDER — LOSARTAN POTASSIUM-HCTZ 100-25 MG PO TABS: 1.0000 | ORAL_TABLET | Freq: Every day | ORAL | Status: DC

## 2014-02-11 NOTE — Patient Instructions (Signed)

## 2014-02-11 NOTE — Progress Notes (Signed)
Pre visit review using our clinic review tool, if applicable. No additional management support is needed unless otherwise documented below in the visit note. 

## 2014-02-11 NOTE — Progress Notes (Signed)
   Subjective:    Patient ID: Rodney [name], male    DOB: September 26, 1966, 48 y.o.   MRN: [id number]  Hypertension Pertinent negatives include no chest pain, headaches, palpitations or shortness of breath.  Hyperlipidemia Pertinent negatives include no chest pain or shortness of breath.  Diabetes Pertinent negatives for hypoglycemia include no dizziness or headaches. Pertinent negatives for diabetes include no chest pain, no fatigue, no polydipsia, no polyphagia and no polyuria.   HPI HYPERTENSION  Blood pressure range-;not checking  Chest pain- no      Dyspnea- no Lightheadedness- no   Edema- no Other side effects - no   Medication compliance: good Low salt diet- no  DIABETES  Blood Sugar ranges-not checking  Polyuria- no New Visual problems- no Hypoglycemic symptoms- no --not since he stopped metformin Other side effects-no Medication compliance - poor Last eye exam- not done Foot exam- today  HYPERLIPIDEMIA  Medication compliance- poor RUQ pain- no  Muscle aches- no Other side effects-no        Review of Systems  Constitutional: Negative for diaphoresis, appetite change, fatigue and unexpected weight change.  Eyes: Negative for pain, redness and visual disturbance.  Respiratory: Negative for cough, chest tightness, shortness of breath and wheezing.   Cardiovascular: Negative for chest pain, palpitations and leg swelling.  Endocrine: Negative for cold intolerance, heat intolerance, polydipsia, polyphagia and polyuria.  Genitourinary: Negative for dysuria, frequency and difficulty urinating.  Neurological: Negative for dizziness, light-headedness, numbness and headaches.       Objective:   Physical Exam  Constitutional: He is oriented to person, place, and time. Vital signs are normal. He appears well-developed and well-nourished. He is sleeping.  HENT:  Head: Normocephalic and atraumatic.  Mouth/Throat: Oropharynx is clear and moist.  Eyes: EOM are normal.  Pupils are equal, round, and reactive to light.  Neck: Normal range of motion. Neck supple. No thyromegaly present.  Cardiovascular: Normal rate and regular rhythm.   No murmur heard. Pulmonary/Chest: Effort normal and breath sounds normal. No respiratory distress. He has no wheezes. He has no rales. He exhibits no tenderness.  Musculoskeletal: He exhibits no edema and no tenderness.  Neurological: He is alert and oriented to person, place, and time.  Skin: Skin is warm and dry.  Psychiatric: He has a normal mood and affect. His behavior is normal. Judgment and thought content normal.  Sensory exam of the foot is normal, tested with the monofilament. Good pulses, no lesions or ulcers, good peripheral pulses.        Assessment & Plan:  1. Other and unspecified hyperlipidemia  - Hepatic function panel - Lipid panel  2. HTN (hypertension) Stable  - Basic metabolic panel - CBC with Differential - POCT urinalysis dipstick - losartan-hydrochlorothiazide (HYZAAR) 100-25 MG per tablet; Take 1 tablet by mouth daily.  Dispense: 90 tablet; Refill: 3  3. Type II or unspecified type diabetes mellitus with peripheral circulatory disorders, uncontrolled(250.72) Check labs  - Basic metabolic panel - CBC with Differential - Hemoglobin A1c - Microalbumin / creatinine urine ratio - POCT urinalysis dipstick

## 2014-02-12 ENCOUNTER — Telehealth: Payer: Self-pay | Admitting: Family Medicine

## 2014-02-12 LAB — CBC WITH DIFFERENTIAL/PLATELET
BASOS ABS: 0 10*3/uL (ref 0.0–0.1)
Basophils Relative: 0.4 % (ref 0.0–3.0)
Eosinophils Absolute: 0.7 10*3/uL (ref 0.0–0.7)
Eosinophils Relative: 6 % — ABNORMAL HIGH (ref 0.0–5.0)
HEMATOCRIT: 42.9 % (ref 39.0–52.0)
Hemoglobin: 14.5 g/dL (ref 13.0–17.0)
LYMPHS ABS: 1.7 10*3/uL (ref 0.7–4.0)
Lymphocytes Relative: 14.5 % (ref 12.0–46.0)
MCHC: 33.7 g/dL (ref 30.0–36.0)
MCV: 84 fl (ref 78.0–100.0)
MONO ABS: 0.8 10*3/uL (ref 0.1–1.0)
Monocytes Relative: 7.1 % (ref 3.0–12.0)
NEUTROS PCT: 72 % (ref 43.0–77.0)
Neutro Abs: 8.2 10*3/uL — ABNORMAL HIGH (ref 1.4–7.7)
PLATELETS: 235 10*3/uL (ref 150.0–400.0)
RBC: 5.1 Mil/uL (ref 4.22–5.81)
RDW: 14.8 % — AB (ref 11.5–14.6)
WBC: 11.4 10*3/uL — ABNORMAL HIGH (ref 4.5–10.5)

## 2014-02-12 NOTE — Telephone Encounter (Signed)
Relevant patient education mailed to patient.  

## 2014-02-13 LAB — URINE CULTURE
COLONY COUNT: NO GROWTH
Organism ID, Bacteria: NO GROWTH

## 2014-02-14 ENCOUNTER — Telehealth: Payer: Self-pay | Admitting: *Deleted

## 2014-02-14 NOTE — Telephone Encounter (Signed)
Message copied by Chilton Greathouse on Thu Feb 14, 2014  9:49 AM ------      Message from: Rosalita Chessman      Created: Tue Feb 12, 2014  7:41 PM       DM not controlled--  Inc metformin xr 500 mg 2 po qpm #60  2 refills      Cholesterol--- LDL goal < 70,  HDL >40,  TG < 150.  Diet and exercise will increase HDL and decrease LDL and TG.  Fish,  Fish Oil, Flaxseed oil will also help increase the HDL and decrease Triglycerides.   Recheck labs in 3 months----  Add antara 90 mg  #30  1 po qd , 2 refills with coupon,      Kidney function is even higher than last summer---- referral was put in for nephrology but pt did not go ---- pt needs a referral to neph.             ------

## 2014-02-25 ENCOUNTER — Other Ambulatory Visit: Payer: Self-pay

## 2014-02-25 DIAGNOSIS — E119 Type 2 diabetes mellitus without complications: Secondary | ICD-10-CM

## 2014-02-25 DIAGNOSIS — N289 Disorder of kidney and ureter, unspecified: Secondary | ICD-10-CM

## 2014-02-25 MED ORDER — METFORMIN HCL ER 500 MG PO TB24: 1000.0000 mg | ORAL_TABLET | Freq: Every evening | ORAL | Status: DC

## 2014-02-25 MED ORDER — FENOFIBRATE MICRONIZED 90 MG PO CAPS: 1.0000 | ORAL_CAPSULE | Freq: Every day | ORAL | Status: DC

## 2014-03-05 ENCOUNTER — Encounter: Payer: Self-pay | Admitting: Family Medicine

## 2014-03-07 ENCOUNTER — Telehealth: Payer: Self-pay

## 2014-03-07 NOTE — Telephone Encounter (Signed)
Relevant patient education mailed to patient.  

## 2014-05-08 ENCOUNTER — Other Ambulatory Visit: Payer: Self-pay | Admitting: Family Medicine

## 2014-05-31 ENCOUNTER — Telehealth: Payer: Self-pay

## 2014-05-31 NOTE — Telephone Encounter (Signed)
LVM for pt to call back and schedule CPE with PCP

## 2014-06-26 ENCOUNTER — Other Ambulatory Visit: Payer: Self-pay | Admitting: Family Medicine

## 2014-07-08 ENCOUNTER — Encounter: Payer: Self-pay | Admitting: General Practice

## 2014-08-05 ENCOUNTER — Other Ambulatory Visit: Payer: Self-pay | Admitting: Family Medicine

## 2014-08-14 ENCOUNTER — Other Ambulatory Visit: Payer: Self-pay | Admitting: Family Medicine

## 2014-09-19 ENCOUNTER — Telehealth: Payer: Self-pay | Admitting: Family Medicine

## 2014-09-19 DIAGNOSIS — I10 Essential (primary) hypertension: Secondary | ICD-10-CM

## 2014-09-19 MED ORDER — LOSARTAN POTASSIUM-HCTZ 100-25 MG PO TABS: 1.0000 | ORAL_TABLET | Freq: Every day | ORAL | Status: DC

## 2014-09-19 NOTE — Telephone Encounter (Signed)
Pt is wanting to know if dr. Etter Sjogren can provide 1 refill for rx losartan-hydrochlorothiazide (HYZAAR) 100-25 MG per tablet, pt is completely out, pt has made appt for office visit on 10/08/14. Send to [facility] on [address].

## 2014-09-19 NOTE — Telephone Encounter (Signed)
Rx sent and wife has been made aware that patient is to fast for his next visit.      KP

## 2014-10-08 ENCOUNTER — Ambulatory Visit (INDEPENDENT_AMBULATORY_CARE_PROVIDER_SITE_OTHER): Payer: Self-pay | Admitting: Family Medicine

## 2014-10-08 ENCOUNTER — Encounter: Payer: Self-pay | Admitting: Family Medicine

## 2014-10-08 VITALS — BP 142/88 | HR 73 | Temp 98.1°F | Wt 235.8 lb

## 2014-10-08 DIAGNOSIS — E1165 Type 2 diabetes mellitus with hyperglycemia: Secondary | ICD-10-CM

## 2014-10-08 DIAGNOSIS — E785 Hyperlipidemia, unspecified: Secondary | ICD-10-CM

## 2014-10-08 DIAGNOSIS — IMO0002 Reserved for concepts with insufficient information to code with codable children: Secondary | ICD-10-CM

## 2014-10-08 DIAGNOSIS — I10 Essential (primary) hypertension: Secondary | ICD-10-CM

## 2014-10-08 LAB — LIPID PANEL
CHOLESTEROL: 190 mg/dL (ref 0–200)
HDL: 24.9 mg/dL — ABNORMAL LOW (ref >39.00)
NonHDL: 165.1
Total CHOL/HDL Ratio: 8
VLDL: 91.4 mg/dL — ABNORMAL HIGH (ref 0.0–40.0)

## 2014-10-08 LAB — BASIC METABOLIC PANEL
BUN: 41 mg/dL — ABNORMAL HIGH (ref 6–23)
CO2: 24 meq/L (ref 19–32)
CREATININE: 2.8 mg/dL — AB (ref 0.4–1.5)
Calcium: 9 mg/dL (ref 8.4–10.5)
Chloride: 103 mEq/L (ref 96–112)
GFR: 25.57 mL/min — ABNORMAL LOW (ref >60.00)
Glucose, Bld: 167 mg/dL — ABNORMAL HIGH (ref 70–99)
POTASSIUM: 4.3 meq/L (ref 3.5–5.1)
Sodium: 138 mEq/L (ref 135–145)

## 2014-10-08 LAB — HEPATIC FUNCTION PANEL
ALT: 14 U/L (ref 0–53)
AST: 15 U/L (ref 0–37)
Albumin: 3.6 g/dL (ref 3.5–5.2)
Alkaline Phosphatase: 103 U/L (ref 39–117)
Bilirubin, Direct: 0 mg/dL (ref 0.0–0.3)
Total Bilirubin: 0.4 mg/dL (ref 0.2–1.2)
Total Protein: 7.5 g/dL (ref 6.0–8.3)

## 2014-10-08 LAB — HEMOGLOBIN A1C: Hgb A1c MFr Bld: 7.1 % — ABNORMAL HIGH (ref 4.6–6.5)

## 2014-10-08 MED ORDER — VERAPAMIL HCL ER 180 MG PO TBCR: 180.0000 mg | EXTENDED_RELEASE_TABLET | Freq: Every day | ORAL | Status: DC

## 2014-10-08 NOTE — Progress Notes (Signed)
Pre visit review using our clinic review tool, if applicable. No additional management support is needed unless otherwise documented below in the visit note. 

## 2014-10-08 NOTE — Progress Notes (Signed)
   Subjective:    Patient ID: Rodney [name], male    DOB: June 19, 1966, 48 y.o.   MRN: [id number]  HPI  HPI HYPERTENSION  Blood pressure range-not checking   Chest pain- no      Dyspnea- no Lightheadedness- no   Edema- no Other side effects - no   Medication compliance: good Low salt diet- no  DIABETES  Blood Sugar ranges-140-170  Polyuria- no New Visual problems- no Hypoglycemic symptoms- no Other side effects-no Medication compliance - poor Last eye exam- due Foot exam- today  HYPERLIPIDEMIA  Medication compliance- poor RUQ pain- no  Muscle aches- no Other side effects-no   Review of Systems    as above Objective:   Physical Exam BP 142/88 mmHg  Pulse 73  Temp(Src) 98.1 F (36.7 C) (Oral)  Wt 235 lb 12.8 oz (106.958 kg)  SpO2 96% General appearance: alert, cooperative, appears stated age and no distress Throat: lips, mucosa, and tongue normal; teeth and gums normal Neck: no adenopathy, no carotid bruit, no JVD, supple, symmetrical, trachea midline and thyroid not enlarged, symmetric, no tenderness/mass/nodules Lungs: clear to auscultation bilaterally Heart: S1, S2 normal Extremities: extremities normal, atraumatic, no cyanosis or edema  Sensory exam of the foot is normal, tested with the monofilament. Good pulses, no lesions or ulcers, good peripheral pulses.         Assessment & Plan:  1. Essential hypertension uncontrolled - verapamil (CALAN SR) 180 MG CR tablet; Take 1 tablet (180 mg total) by mouth at bedtime.  Dispense: 30 tablet; Refill: 2 - Hepatic function panel - Lipid panel  2. Diabetes type 2, uncontrolled Off meds--- discussed importance of taking meds-- check labs today - Hemoglobin A1c - Microalbumin / creatinine urine ratio - Basic metabolic panel  3. Hyperlipemia Pt not taking meds-- he "just did not want to" - Hepatic function panel - Lipid panel

## 2014-10-08 NOTE — Patient Instructions (Signed)
Diabetes and Standards of Medical Care Diabetes is complicated. You may find that your diabetes team includes a dietitian, nurse, diabetes educator, eye doctor, and more. To help everyone know what is going on and to help you get the care you deserve, the following schedule of care was developed to help keep you on track. Below are the tests, exams, vaccines, medicines, education, and plans you will need. HbA1c test This test shows how well you have controlled your glucose over the past 2-3 months. It is used to see if your diabetes management plan needs to be adjusted.   It is performed at least 2 times a year if you are meeting treatment goals.  It is performed 4 times a year if therapy has changed or if you are not meeting treatment goals. Blood pressure test  This test is performed at every routine medical visit. The goal is less than 140/90 mm Hg for most people, but 130/80 mm Hg in some cases. Ask your health care provider about your goal. Dental exam  Follow up with the dentist regularly. Eye exam  If you are diagnosed with type 1 diabetes as a child, get an exam upon reaching the age of 37 years or older and have had diabetes for 50-5 years. Yearly eye exams are recommended after that initial eye exam.  If you are diagnosed with type 1 diabetes as an adult, get an exam within 5 years of diagnosis and then yearly.  If you are diagnosed with type 2 diabetes, get an exam as soon as possible after the diagnosis and then yearly. Foot care exam  Visual foot exams are performed at every routine medical visit. The exams check for cuts, injuries, or other problems with the feet.  A comprehensive foot exam should be done yearly. This includes visual inspection as well as assessing foot pulses and testing for loss of sensation.  Check your feet nightly for cuts, injuries, or other problems with your feet. Tell your health care provider if anything is not healing. Kidney function test (urine  microalbumin)  This test is performed once a year.  Type 1 diabetes: The first test is performed 5 years after diagnosis.  Type 2 diabetes: The first test is performed at the time of diagnosis.  A serum creatinine and estimated glomerular filtration rate (eGFR) test is done once a year to assess the level of chronic kidney disease (CKD), if present. Lipid profile (cholesterol, HDL, LDL, triglycerides)  Performed every 5 years for most people.  The goal for LDL is less than 100 mg/dL. If you are at high risk, the goal is less than 70 mg/dL.  The goal for HDL is 40 mg/dL-50 mg/dL for men and 50 mg/dL-60 mg/dL for women. An HDL cholesterol of 60 mg/dL or higher gives some protection against heart disease.  The goal for triglycerides is less than 150 mg/dL. Influenza vaccine, pneumococcal vaccine, and hepatitis B vaccine  The influenza vaccine is recommended yearly.  It is recommended that people with diabetes who are over 50 years old get the pneumonia vaccine. In some cases, two separate shots may be given. Ask your health care provider if your pneumonia vaccination is up to date.  The hepatitis B vaccine is also recommended for adults with diabetes. Diabetes self-management education  Education is recommended at diagnosis and ongoing as needed. Treatment plan  Your treatment plan is reviewed at every medical visit. Document Released: 08/29/2009 Document Revised: 03/18/2014 Document Reviewed: 04/03/2013 [facility] Patient Information 2015 [place],  [employer]. This information is not intended to replace advice given to you by your health care provider. Make sure you discuss any questions you have with your health care provider.  

## 2014-10-09 LAB — MICROALBUMIN / CREATININE URINE RATIO
CREATININE, U: 95.8 mg/dL
Microalb Creat Ratio: 248.8 mg/g — ABNORMAL HIGH (ref 0.0–30.0)
Microalb, Ur: 238.2 mg/dL — ABNORMAL HIGH (ref 0.0–1.9)

## 2014-10-09 LAB — LDL CHOLESTEROL, DIRECT: LDL DIRECT: 97.7 mg/dL

## 2014-10-25 ENCOUNTER — Other Ambulatory Visit: Payer: Self-pay

## 2014-10-25 MED ORDER — METFORMIN HCL ER 500 MG PO TB24: 1000.0000 mg | ORAL_TABLET | Freq: Every evening | ORAL | Status: DC

## 2014-10-25 MED ORDER — FENOFIBRATE MICRONIZED 90 MG PO CAPS: 1.0000 | ORAL_CAPSULE | Freq: Every day | ORAL | Status: DC

## 2014-12-11 ENCOUNTER — Telehealth: Payer: Self-pay | Admitting: Family Medicine

## 2014-12-11 NOTE — Telephone Encounter (Signed)
Caller name:Conne Corwin Relationship to patient:spouse Can be reached:[phone] or [phone] Pharmacy:[facility] [address]   Reason for call:Need refill on metFORMIN (GLUCOPHAGE XR) 500 MG 24 hr tablet/[facility] says they do not have refill on file.

## 2014-12-12 MED ORDER — METFORMIN HCL ER 500 MG PO TB24
1000.0000 mg | ORAL_TABLET | Freq: Every evening | ORAL | Status: DC
Start: 2014-12-12 — End: 2015-03-25

## 2014-12-12 NOTE — Telephone Encounter (Signed)
Rx re-sent   KP 

## 2014-12-16 ENCOUNTER — Telehealth: Payer: Self-pay | Admitting: Family Medicine

## 2014-12-16 DIAGNOSIS — I10 Essential (primary) hypertension: Secondary | ICD-10-CM

## 2014-12-16 MED ORDER — VERAPAMIL HCL ER 180 MG PO TBCR: 180.0000 mg | EXTENDED_RELEASE_TABLET | Freq: Every day | ORAL | Status: DC

## 2014-12-16 NOTE — Telephone Encounter (Signed)
Caller name: Lamondre, [place] Relation to pt: self  Call back number: [phone] Pharmacy: [facility] [address] ([address]), [place] [phone] (Phone) [phone] (Fax)     Reason for call:  Spouse requesting a refill  verapamil (CALAN SR) 180 MG CR tablet

## 2014-12-16 NOTE — Telephone Encounter (Signed)
Rx faxed.    KP 

## 2015-01-03 ENCOUNTER — Telehealth: Payer: Self-pay | Admitting: Family Medicine

## 2015-01-03 DIAGNOSIS — I10 Essential (primary) hypertension: Secondary | ICD-10-CM

## 2015-01-03 MED ORDER — LOSARTAN POTASSIUM-HCTZ 100-25 MG PO TABS: 1.0000 | ORAL_TABLET | Freq: Every day | ORAL | Status: DC

## 2015-01-03 NOTE — Telephone Encounter (Signed)
Rx faxed.    KP 

## 2015-01-03 NOTE — Telephone Encounter (Signed)
Caller name:Baraka Relation to pt: self Call back number: [phone] Pharmacy: [facility]  Reason for call:   Requesting refill of losartan. Took last pill today

## 2015-02-10 ENCOUNTER — Other Ambulatory Visit: Payer: Self-pay

## 2015-02-10 MED ORDER — CYANOCOBALAMIN 1000 MCG/ML IJ SOLN: 1000.0000 ug | INTRAMUSCULAR | Status: DC

## 2015-02-10 MED ORDER — ATENOLOL 100 MG PO TABS: 50.0000 mg | ORAL_TABLET | Freq: Every day | ORAL | Status: DC

## 2015-03-14 ENCOUNTER — Other Ambulatory Visit: Payer: Self-pay | Admitting: Family Medicine

## 2015-03-17 NOTE — Telephone Encounter (Signed)
Informed patient of med refill and he scheduled appointment for 03/25/15

## 2015-03-17 NOTE — Telephone Encounter (Signed)
Please schedule an OV for a BP check.      KP

## 2015-03-25 ENCOUNTER — Ambulatory Visit (INDEPENDENT_AMBULATORY_CARE_PROVIDER_SITE_OTHER): Payer: Self-pay | Admitting: Family Medicine

## 2015-03-25 ENCOUNTER — Encounter: Payer: Self-pay | Admitting: Family Medicine

## 2015-03-25 VITALS — BP 138/86 | HR 80 | Temp 98.0°F | Wt 226.0 lb

## 2015-03-25 DIAGNOSIS — I10 Essential (primary) hypertension: Secondary | ICD-10-CM

## 2015-03-25 DIAGNOSIS — E785 Hyperlipidemia, unspecified: Secondary | ICD-10-CM

## 2015-03-25 DIAGNOSIS — E1165 Type 2 diabetes mellitus with hyperglycemia: Secondary | ICD-10-CM

## 2015-03-25 DIAGNOSIS — IMO0002 Reserved for concepts with insufficient information to code with codable children: Secondary | ICD-10-CM

## 2015-03-25 DIAGNOSIS — R319 Hematuria, unspecified: Secondary | ICD-10-CM

## 2015-03-25 LAB — HEPATIC FUNCTION PANEL
ALT: 10 U/L (ref 0–53)
AST: 11 U/L (ref 0–37)
Albumin: 3.8 g/dL (ref 3.5–5.2)
Alkaline Phosphatase: 117 U/L (ref 39–117)
Bilirubin, Direct: 0 mg/dL (ref 0.0–0.3)
TOTAL PROTEIN: 7.7 g/dL (ref 6.0–8.3)
Total Bilirubin: 0.3 mg/dL (ref 0.2–1.2)

## 2015-03-25 LAB — BASIC METABOLIC PANEL
BUN: 40 mg/dL — ABNORMAL HIGH (ref 6–23)
CHLORIDE: 99 meq/L (ref 96–112)
CO2: 23 meq/L (ref 19–32)
CREATININE: 2.86 mg/dL — AB (ref 0.40–1.50)
Calcium: 9.1 mg/dL (ref 8.4–10.5)
GFR: 24.91 mL/min — ABNORMAL LOW (ref >60.00)
GLUCOSE: 147 mg/dL — AB (ref 70–99)
POTASSIUM: 3.9 meq/L (ref 3.5–5.1)
Sodium: 132 mEq/L — ABNORMAL LOW (ref 135–145)

## 2015-03-25 LAB — POCT URINALYSIS DIPSTICK
Bilirubin, UA: NEGATIVE
Blood, UA: POSITIVE
Glucose, UA: NEGATIVE
Ketones, UA: NEGATIVE
Leukocytes, UA: NEGATIVE
Nitrite, UA: NEGATIVE
PH UA: 6
Spec Grav, UA: 1.025
Urobilinogen, UA: 4

## 2015-03-25 LAB — HEMOGLOBIN A1C: HEMOGLOBIN A1C: 6.9 % — AB (ref 4.6–6.5)

## 2015-03-25 LAB — LIPID PANEL
CHOLESTEROL: 159 mg/dL (ref 0–200)
HDL: 27 mg/dL — ABNORMAL LOW (ref >39.00)
NonHDL: 132
Total CHOL/HDL Ratio: 6
Triglycerides: 286 mg/dL — ABNORMAL HIGH (ref 0.0–149.0)
VLDL: 57.2 mg/dL — AB (ref 0.0–40.0)

## 2015-03-25 LAB — MICROALBUMIN / CREATININE URINE RATIO
Creatinine,U: 105.8 mg/dL
MICROALB UR: 90.9 mg/dL — AB (ref 0.0–1.9)
MICROALB/CREAT RATIO: 85.9 mg/g — AB (ref 0.0–30.0)

## 2015-03-25 LAB — LDL CHOLESTEROL, DIRECT: LDL DIRECT: 96 mg/dL

## 2015-03-25 MED ORDER — VERAPAMIL HCL ER 180 MG PO TBCR: 180.0000 mg | EXTENDED_RELEASE_TABLET | Freq: Every day | ORAL | Status: DC

## 2015-03-25 MED ORDER — LOSARTAN POTASSIUM-HCTZ 100-25 MG PO TABS: 1.0000 | ORAL_TABLET | Freq: Every day | ORAL | Status: DC

## 2015-03-25 MED ORDER — VERAPAMIL HCL ER 240 MG PO TBCR: 240.0000 mg | EXTENDED_RELEASE_TABLET | Freq: Every day | ORAL | Status: DC

## 2015-03-25 MED ORDER — METFORMIN HCL ER 500 MG PO TB24: 1000.0000 mg | ORAL_TABLET | Freq: Every evening | ORAL | Status: DC

## 2015-03-25 NOTE — Progress Notes (Signed)
Pre visit review using our clinic review tool, if applicable. No additional management support is needed unless otherwise documented below in the visit note. 

## 2015-03-25 NOTE — Patient Instructions (Signed)

## 2015-03-25 NOTE — Progress Notes (Signed)
Patient ID: Rodney [name], male    DOB: 27-Mar-1966  Age: 48 y.o. MRN: [id number]    Subjective:  Subjective HPI Rodney [name] presents for f/u dm , htn and cholesterol.   HYPERTENSION  Blood pressure range-not checking   Chest pain- no      Dyspnea- no Lightheadedness- no   Edema- no Other side effects - no   Medication compliance: good Low salt diet- yes  DIABETES  Blood Sugar ranges-not checking   Polyuria- no New Visual problems- no Hypoglycemic symptoms- no Other side effects-- no Medication compliance - good Last eye exam- due Foot exam- today  HYPERLIPIDEMIA  Medication compliance- good RUQ pain- no  Muscle aches- no Other side effects-no   Review of Systems  Constitutional: Negative for diaphoresis, appetite change, fatigue and unexpected weight change.  Eyes: Negative for pain, redness and visual disturbance.  Respiratory: Negative for cough, chest tightness, shortness of breath and wheezing.   Cardiovascular: Negative for chest pain, palpitations and leg swelling.  Endocrine: Negative for cold intolerance, heat intolerance, polydipsia, polyphagia and polyuria.  Genitourinary: Negative for dysuria, frequency and difficulty urinating.  Neurological: Negative for dizziness, light-headedness, numbness and headaches.  Psychiatric/Behavioral: Negative for dysphoric mood and decreased concentration. The patient is not nervous/anxious.     History Past Medical History  Diagnosis Date  . Gout   . Hyperlipidemia   . Hypertension   . Diabetes mellitus     Type 2    He has no past surgical history on file.   His family history includes Cancer (age of onset: 78) in his mother; Heart disease in his maternal aunt and maternal uncle; Heart disease (age of onset: 63) in his mother; Hyperlipidemia in his father; Hypertension in his father; Stroke in his father.He reports that he has never smoked. He has never used smokeless tobacco. He reports that he drinks about 4.2  oz of alcohol per week. He reports that he does not use illicit drugs.  Current Outpatient Prescriptions on File Prior to Visit  Medication Sig Dispense Refill  . aspirin 81 MG tablet Take 81 mg by mouth daily.      Marland Kitchen atenolol (TENORMIN) 100 MG tablet Take 0.5 tablets (50 mg total) by mouth daily. 90 tablet 1  . cyanocobalamin (,VITAMIN B-12,) 1000 MCG/ML injection Inject 1 mL (1,000 mcg total) into the muscle every 30 (thirty) days. 3 mL 1  . Omega-3 Fatty Acids (FISH OIL) 1000 MG CAPS Take 2 capsules by mouth 2 (two) times daily.      . SYRINGE/NEEDLE, DISP, 1 ML (BD ECLIPSE SYRINGE) 25G X 5/8" 1 ML MISC 1 mL by Does not apply route every 30 (thirty) days. 50 each 0   No current facility-administered medications on file prior to visit.     Objective:  Objective Physical Exam  Constitutional: He is oriented to person, place, and time. Vital signs are normal. He appears well-developed and well-nourished. He is sleeping.  HENT:  Head: Normocephalic and atraumatic.  Mouth/Throat: Oropharynx is clear and moist.  Eyes: EOM are normal. Pupils are equal, round, and reactive to light.  Neck: Normal range of motion. Neck supple. No thyromegaly present.  Cardiovascular: Normal rate and regular rhythm.   No murmur heard. Pulmonary/Chest: Effort normal and breath sounds normal. No respiratory distress. He has no wheezes. He has no rales. He exhibits no tenderness.  Musculoskeletal: He exhibits no edema or tenderness.  Neurological: He is alert and oriented to person, place, and time.  Skin:  Skin is warm and dry. Rash noted. There is erythema.     Psychiatric: He has a normal mood and affect. His behavior is normal. Judgment and thought content normal.   BP 138/86 mmHg  Pulse 80  Temp(Src) 98 F (36.7 C) (Oral)  Wt 226 lb (102.513 kg)  SpO2 99% Wt Readings from Last 3 Encounters:  03/25/15 226 lb (102.513 kg)  10/08/14 235 lb 12.8 oz (106.958 kg)  02/11/14 244 lb 6.4 oz (110.859 kg)      Lab Results  Component Value Date   WBC 11.4* 02/11/2014   HGB 14.5 02/11/2014   HCT 42.9 02/11/2014   PLT 235.0 02/11/2014   GLUCOSE 167* 10/08/2014   CHOL 190 10/08/2014   TRIG * 10/08/2014    457.0 Triglyceride is over 400; calculations on Lipids are invalid.   HDL 24.90* 10/08/2014   LDLDIRECT 97.7 10/08/2014   LDLCALC 90 02/11/2014   ALT 14 10/08/2014   AST 15 10/08/2014   NA 138 10/08/2014   K 4.3 10/08/2014   CL 103 10/08/2014   CREATININE 2.8* 10/08/2014   BUN 41* 10/08/2014   CO2 24 10/08/2014   TSH 3.96 08/01/2012   PSA 0.41 08/01/2012   HGBA1C 7.1* 10/08/2014   MICROALBUR 238.2* 10/08/2014    No results found.   Assessment & Plan:  Plan I have discontinued Mr. Dorsi's naproxen, atorvastatin, Fenofibrate Micronized, verapamil, and verapamil. I have also changed his losartan-hydrochlorothiazide. Additionally, I am having him start on verapamil. Lastly, I am having him maintain his aspirin, Fish Oil, SYRINGE/NEEDLE (DISP) 1 ML, cyanocobalamin, atenolol, and metFORMIN.  Meds ordered this encounter  Medications  . DISCONTD: verapamil (CALAN SR) 180 MG CR tablet    Sig: Take 1 tablet (180 mg total) by mouth at bedtime.    Dispense:  90 tablet    Refill:  1  . metFORMIN (GLUCOPHAGE XR) 500 MG 24 hr tablet    Sig: Take 2 tablets (1,000 mg total) by mouth every evening.    Dispense:  180 tablet    Refill:  1  . losartan-hydrochlorothiazide (HYZAAR) 100-25 MG per tablet    Sig: Take 1 tablet by mouth daily.    Dispense:  90 tablet    Refill:  1  . verapamil (CALAN-SR) 240 MG CR tablet    Sig: Take 1 tablet (240 mg total) by mouth at bedtime.    Dispense:  90 tablet    Refill:  3    D/C PREVIOUS SCRIPTS FOR THIS MEDICATION    Problem List Items Addressed This Visit    None    Visit Diagnoses    Essential hypertension    -  Primary    Relevant Medications    losartan-hydrochlorothiazide (HYZAAR) 100-25 MG per tablet    verapamil (CALAN-SR) 240 MG  CR tablet    Other Relevant Orders    Basic metabolic panel    Hemoglobin A1c    Hepatic function panel    Lipid panel    Microalbumin / creatinine urine ratio    POCT urinalysis dipstick    Diabetes mellitus type II, uncontrolled        Relevant Medications    metFORMIN (GLUCOPHAGE XR) 500 MG 24 hr tablet    losartan-hydrochlorothiazide (HYZAAR) 100-25 MG per tablet    Other Relevant Orders    Basic metabolic panel    Hemoglobin A1c    Hepatic function panel    Hyperlipidemia LDL goal <70        Relevant  Medications    losartan-hydrochlorothiazide (HYZAAR) 100-25 MG per tablet    verapamil (CALAN-SR) 240 MG CR tablet    Other Relevant Orders    Hepatic function panel    Lipid panel       Follow-up: Return in about 3 months (around 06/25/2015), or if symptoms worsen or fail to improve, for f/u and labs.  Garnet Koyanagi, DO

## 2015-03-25 NOTE — Addendum Note (Signed)
Addended by: Modena Morrow D on: 03/25/2015 03:36 PM   Modules accepted: Orders

## 2015-03-27 LAB — URINE CULTURE
Colony Count: NO GROWTH
ORGANISM ID, BACTERIA: NO GROWTH

## 2015-04-02 ENCOUNTER — Other Ambulatory Visit: Payer: Self-pay

## 2015-04-02 DIAGNOSIS — N289 Disorder of kidney and ureter, unspecified: Secondary | ICD-10-CM

## 2015-04-02 DIAGNOSIS — E118 Type 2 diabetes mellitus with unspecified complications: Secondary | ICD-10-CM

## 2015-04-08 ENCOUNTER — Other Ambulatory Visit: Payer: Self-pay

## 2015-04-08 DIAGNOSIS — E1129 Type 2 diabetes mellitus with other diabetic kidney complication: Secondary | ICD-10-CM

## 2015-04-08 MED ORDER — INSULIN GLARGINE 100 UNIT/ML SOLOSTAR PEN: 20.0000 [IU] | PEN_INJECTOR | Freq: Every day | SUBCUTANEOUS | Status: DC

## 2015-04-23 ENCOUNTER — Encounter: Payer: Self-pay | Admitting: Endocrinology

## 2015-08-16 ENCOUNTER — Emergency Department (HOSPITAL_COMMUNITY): Payer: Self-pay

## 2015-08-16 ENCOUNTER — Encounter (HOSPITAL_COMMUNITY): Payer: Self-pay | Admitting: Emergency Medicine

## 2015-08-16 ENCOUNTER — Inpatient Hospital Stay (HOSPITAL_COMMUNITY)
Admission: EM | Admit: 2015-08-16 | Discharge: 2015-08-20 | DRG: 683 | Disposition: A | Discharge status: Home / Self Care | Payer: Self-pay | Attending: Internal Medicine | Admitting: Internal Medicine

## 2015-08-16 DIAGNOSIS — I10 Essential (primary) hypertension: Secondary | ICD-10-CM | POA: Diagnosis present

## 2015-08-16 DIAGNOSIS — K2990 Gastroduodenitis, unspecified, without bleeding: Secondary | ICD-10-CM | POA: Diagnosis present

## 2015-08-16 DIAGNOSIS — D509 Iron deficiency anemia, unspecified: Secondary | ICD-10-CM | POA: Diagnosis present

## 2015-08-16 DIAGNOSIS — R7989 Other specified abnormal findings of blood chemistry: Secondary | ICD-10-CM

## 2015-08-16 DIAGNOSIS — K2920 Alcoholic gastritis without bleeding: Secondary | ICD-10-CM | POA: Diagnosis present

## 2015-08-16 DIAGNOSIS — E1122 Type 2 diabetes mellitus with diabetic chronic kidney disease: Secondary | ICD-10-CM | POA: Diagnosis present

## 2015-08-16 DIAGNOSIS — I129 Hypertensive chronic kidney disease with stage 1 through stage 4 chronic kidney disease, or unspecified chronic kidney disease: Secondary | ICD-10-CM | POA: Diagnosis present

## 2015-08-16 DIAGNOSIS — N179 Acute kidney failure, unspecified: Secondary | ICD-10-CM | POA: Diagnosis present

## 2015-08-16 DIAGNOSIS — N189 Chronic kidney disease, unspecified: Secondary | ICD-10-CM

## 2015-08-16 DIAGNOSIS — E119 Type 2 diabetes mellitus without complications: Secondary | ICD-10-CM

## 2015-08-16 DIAGNOSIS — R109 Unspecified abdominal pain: Secondary | ICD-10-CM

## 2015-08-16 DIAGNOSIS — N170 Acute kidney failure with tubular necrosis: Principal | ICD-10-CM | POA: Diagnosis present

## 2015-08-16 DIAGNOSIS — E860 Dehydration: Secondary | ICD-10-CM | POA: Diagnosis present

## 2015-08-16 DIAGNOSIS — Z7982 Long term (current) use of aspirin: Secondary | ICD-10-CM

## 2015-08-16 DIAGNOSIS — Z794 Long term (current) use of insulin: Secondary | ICD-10-CM

## 2015-08-16 DIAGNOSIS — N183 Chronic kidney disease, stage 3 (moderate): Secondary | ICD-10-CM | POA: Diagnosis present

## 2015-08-16 DIAGNOSIS — Z791 Long term (current) use of non-steroidal anti-inflammatories (NSAID): Secondary | ICD-10-CM

## 2015-08-16 DIAGNOSIS — D638 Anemia in other chronic diseases classified elsewhere: Secondary | ICD-10-CM | POA: Diagnosis present

## 2015-08-16 DIAGNOSIS — E785 Hyperlipidemia, unspecified: Secondary | ICD-10-CM | POA: Diagnosis present

## 2015-08-16 DIAGNOSIS — R1013 Epigastric pain: Secondary | ICD-10-CM | POA: Diagnosis present

## 2015-08-16 DIAGNOSIS — D649 Anemia, unspecified: Secondary | ICD-10-CM | POA: Diagnosis present

## 2015-08-16 DIAGNOSIS — R17 Unspecified jaundice: Secondary | ICD-10-CM

## 2015-08-16 DIAGNOSIS — Z23 Encounter for immunization: Secondary | ICD-10-CM

## 2015-08-16 DIAGNOSIS — R945 Abnormal results of liver function studies: Secondary | ICD-10-CM

## 2015-08-16 DIAGNOSIS — K2970 Gastritis, unspecified, without bleeding: Secondary | ICD-10-CM

## 2015-08-16 LAB — URINE MICROSCOPIC-ADD ON

## 2015-08-16 LAB — URINALYSIS, ROUTINE W REFLEX MICROSCOPIC
GLUCOSE, UA: NEGATIVE mg/dL
KETONES UR: NEGATIVE mg/dL
Nitrite: NEGATIVE
PH: 5.5 (ref 5.0–8.0)
Protein, ur: >300 mg/dL — AB
Specific Gravity, Urine: 1.016 (ref 1.005–1.030)
Urobilinogen, UA: 1 mg/dL (ref 0.0–1.0)

## 2015-08-16 LAB — IRON AND TIBC
IRON: 12 ug/dL — AB (ref 45–182)
Saturation Ratios: 4 % — ABNORMAL LOW (ref 17.9–39.5)
TIBC: 280 ug/dL (ref 250–450)
UIBC: 268 ug/dL

## 2015-08-16 LAB — I-STAT TROPONIN, ED: Troponin i, poc: 0 ng/mL (ref 0.00–0.08)

## 2015-08-16 LAB — OSMOLALITY, URINE: Osmolality, Ur: 255 mOsm/kg — ABNORMAL LOW (ref 390–1090)

## 2015-08-16 LAB — CBC
HCT: 31 % — ABNORMAL LOW (ref 39.0–52.0)
Hemoglobin: 10.4 g/dL — ABNORMAL LOW (ref 13.0–17.0)
MCH: 25 pg — AB (ref 26.0–34.0)
MCHC: 33.5 g/dL (ref 30.0–36.0)
MCV: 74.5 fL — AB (ref 78.0–100.0)
PLATELETS: 193 10*3/uL (ref 150–400)
RBC: 4.16 MIL/uL — AB (ref 4.22–5.81)
RDW: 15.1 % (ref 11.5–15.5)
WBC: 5.8 10*3/uL (ref 4.0–10.5)

## 2015-08-16 LAB — GLUCOSE, CAPILLARY
Glucose-Capillary: 123 mg/dL — ABNORMAL HIGH (ref 65–99)
Glucose-Capillary: 119 mg/dL — ABNORMAL HIGH (ref 65–99)

## 2015-08-16 LAB — PROTIME-INR
INR: 1.06 (ref 0.00–1.49)
Prothrombin Time: 14 seconds (ref 11.6–15.2)

## 2015-08-16 LAB — LIPASE, BLOOD: LIPASE: 50 U/L (ref 22–51)

## 2015-08-16 LAB — COMPREHENSIVE METABOLIC PANEL
ALK PHOS: 198 U/L — AB (ref 38–126)
ALT: 76 U/L — AB (ref 17–63)
AST: 47 U/L — AB (ref 15–41)
Albumin: 2.8 g/dL — ABNORMAL LOW (ref 3.5–5.0)
Anion gap: 14 (ref 5–15)
BUN: 55 mg/dL — AB (ref 6–20)
CHLORIDE: 97 mmol/L — AB (ref 101–111)
CO2: 20 mmol/L — AB (ref 22–32)
CREATININE: 5.81 mg/dL — AB (ref 0.61–1.24)
Calcium: 8.9 mg/dL (ref 8.9–10.3)
GFR calc Af Amer: 12 mL/min — ABNORMAL LOW (ref >60)
GFR calc non Af Amer: 10 mL/min — ABNORMAL LOW (ref >60)
GLUCOSE: 168 mg/dL — AB (ref 65–99)
Potassium: 4 mmol/L (ref 3.5–5.1)
SODIUM: 131 mmol/L — AB (ref 135–145)
Total Bilirubin: 4.8 mg/dL — ABNORMAL HIGH (ref 0.3–1.2)
Total Protein: 6.7 g/dL (ref 6.5–8.1)

## 2015-08-16 LAB — RETICULOCYTES
RBC.: 3.91 MIL/uL — ABNORMAL LOW (ref 4.22–5.81)
RETIC CT PCT: 0.7 % (ref 0.4–3.1)
Retic Count, Absolute: 27.4 10*3/uL (ref 19.0–186.0)

## 2015-08-16 LAB — FOLATE: FOLATE: 15 ng/mL (ref >5.9)

## 2015-08-16 LAB — FERRITIN: Ferritin: 110 ng/mL (ref 24–336)

## 2015-08-16 LAB — SODIUM, URINE, RANDOM: SODIUM UR: 65 mmol/L

## 2015-08-16 LAB — POC OCCULT BLOOD, ED: FECAL OCCULT BLD: NEGATIVE

## 2015-08-16 LAB — VITAMIN B12: VITAMIN B 12: 2940 pg/mL — AB (ref 180–914)

## 2015-08-16 MED ORDER — INSULIN GLARGINE 100 UNIT/ML ~~LOC~~ SOLN
20.0000 [IU] | Freq: Every day | SUBCUTANEOUS | Status: DC
  Filled 2015-08-16: qty 0.2

## 2015-08-16 MED ORDER — INSULIN ASPART 100 UNIT/ML ~~LOC~~ SOLN
0.0000 [IU] | Freq: Three times a day (TID) | SUBCUTANEOUS | Status: DC
Start: 2015-08-16 — End: 2015-08-17
  Administered 2015-08-16 – 2015-08-17 (×2): 1 [IU] via SUBCUTANEOUS

## 2015-08-16 MED ORDER — SODIUM CHLORIDE 0.9 % IV SOLN
INTRAVENOUS | Status: DC
  Administered 2015-08-16: 1000 mL via INTRAVENOUS

## 2015-08-16 MED ORDER — ONDANSETRON HCL 4 MG/2ML IJ SOLN
4.0000 mg | Freq: Once | INTRAMUSCULAR | Status: AC
  Administered 2015-08-16: 4 mg via INTRAVENOUS
  Filled 2015-08-16: qty 2

## 2015-08-16 MED ORDER — ATENOLOL 50 MG PO TABS
50.0000 mg | ORAL_TABLET | Freq: Every day | ORAL | Status: DC
  Administered 2015-08-17 – 2015-08-20 (×4): 50 mg via ORAL
  Filled 2015-08-16 (×4): qty 1

## 2015-08-16 MED ORDER — ACETAMINOPHEN 650 MG RE SUPP: 650.0000 mg | Freq: Four times a day (QID) | RECTAL | Status: DC | PRN

## 2015-08-16 MED ORDER — ACETAMINOPHEN 325 MG PO TABS: 650.0000 mg | ORAL_TABLET | Freq: Four times a day (QID) | ORAL | Status: DC | PRN

## 2015-08-16 MED ORDER — VERAPAMIL HCL ER 240 MG PO TBCR
240.0000 mg | EXTENDED_RELEASE_TABLET | Freq: Every day | ORAL | Status: DC
  Administered 2015-08-17: 240 mg via ORAL
  Filled 2015-08-16 (×2): qty 1

## 2015-08-16 MED ORDER — SODIUM CHLORIDE 0.9 % IV SOLN
8.0000 mg/h | INTRAVENOUS | Status: DC
  Administered 2015-08-16: 8 mg/h via INTRAVENOUS
  Filled 2015-08-16 (×4): qty 80

## 2015-08-16 MED ORDER — PANTOPRAZOLE SODIUM 40 MG IV SOLR: 40.0000 mg | Freq: Two times a day (BID) | INTRAVENOUS | Status: DC

## 2015-08-16 MED ORDER — FISH OIL 1000 MG PO CAPS: 2.0000 | ORAL_CAPSULE | Freq: Two times a day (BID) | ORAL | Status: DC

## 2015-08-16 MED ORDER — SODIUM CHLORIDE 0.9 % IV SOLN
80.0000 mg | Freq: Once | INTRAVENOUS | Status: AC
  Administered 2015-08-16: 80 mg via INTRAVENOUS
  Filled 2015-08-16: qty 80

## 2015-08-16 MED ORDER — MORPHINE SULFATE (PF) 4 MG/ML IV SOLN
4.0000 mg | Freq: Once | INTRAVENOUS | Status: AC
  Administered 2015-08-16: 4 mg via INTRAVENOUS
  Filled 2015-08-16: qty 1

## 2015-08-16 MED ORDER — SODIUM CHLORIDE 0.9 % IV BOLUS (SEPSIS)
1000.0000 mL | Freq: Once | INTRAVENOUS | Status: AC
  Administered 2015-08-16: 1000 mL via INTRAVENOUS

## 2015-08-16 MED ORDER — PANTOPRAZOLE SODIUM 40 MG IV SOLR
40.0000 mg | Freq: Once | INTRAVENOUS | Status: AC
  Administered 2015-08-16: 40 mg via INTRAVENOUS
  Filled 2015-08-16: qty 40

## 2015-08-16 MED ORDER — OMEGA-3-ACID ETHYL ESTERS 1 G PO CAPS
2.0000 g | ORAL_CAPSULE | Freq: Two times a day (BID) | ORAL | Status: DC
  Administered 2015-08-16 – 2015-08-20 (×8): 2 g via ORAL
  Filled 2015-08-16 (×8): qty 2

## 2015-08-16 NOTE — Progress Notes (Addendum)
Patient trasfered from ED to [phone] via stretcher; alert and oriented x 4; no complaints of pain at this time because pt was medicated in ED; IV in [place] running fluids at 125cc/hr; skin intact. Orient patient to room and unit; watch safety video; gave patient care guide; instructed how to use the call bell and  fall risk precautions. Will continue to monitor the patient.

## 2015-08-16 NOTE — H&P (Signed)
PCP:   Garnet Koyanagi, DO   Chief Complaint:  Abdominal pain  HPI: 49 year old male who   has a past medical history of Gout; Hyperlipidemia; Hypertension; and Diabetes mellitus. Today presents to the hospital with worsening epigastric and upper abdominal pain for past 1 week. Patient says that the pain was associated with nausea and vomiting. Patient says he vomited 3-4 times with blood-tinged vomitus but no frank bleeding. The pain was intermittent and rated 5/10 in intensity, got temporary relief from Zantac. Denies fever, no dysuria urgency frequency of urination. No weakness or dizziness. Patient says that he has not been able to eat and drink much over the past 1 week. In the ED patient found to be in acute kidney injury on chronic kidney disease. Today creatinine is 5.81, patient baseline creatinine is 2.86 as of May 2016. He also admits to be taking NSAIDs quite regularly until 2 weeks ago, says he took Advil 2 tablets 2 days ago. Today patient's hemoglobin is 10.4, his baseline hemoglobin is 14.5 as of March 2015.  Allergies:   Allergies  Allergen Reactions  . Simvastatin     REACTION: muscle aches      Past Medical History  Diagnosis Date  . Gout   . Hyperlipidemia   . Hypertension   . Diabetes mellitus     Type 2    History reviewed. No pertinent past surgical history.  Prior to Admission medications   Medication Sig Start Date End Date Taking? Authorizing Provider  aspirin 81 MG tablet Take 81 mg by mouth daily.      Historical Provider, MD  atenolol (TENORMIN) 100 MG tablet Take 0.5 tablets (50 mg total) by mouth daily. 02/10/15   Rosalita Chessman, DO  cyanocobalamin (,VITAMIN B-12,) 1000 MCG/ML injection Inject 1 mL (1,000 mcg total) into the muscle every 30 (thirty) days. 02/10/15   Rosalita Chessman, DO  Insulin Glargine (LANTUS) 100 UNIT/ML Solostar Pen Inject 20 Units into the skin daily. 04/08/15   Rosalita Chessman, DO  losartan-hydrochlorothiazide (HYZAAR) 100-25 MG  per tablet Take 1 tablet by mouth daily. 03/25/15   Rosalita Chessman, DO  Omega-3 Fatty Acids (FISH OIL) 1000 MG CAPS Take 2 capsules by mouth 2 (two) times daily.      Historical Provider, MD  SYRINGE/NEEDLE, DISP, 1 ML (BD ECLIPSE SYRINGE) 25G X 5/8" 1 ML MISC 1 mL by Does not apply route every 30 (thirty) days. 06/21/12   Rosalita Chessman, DO  verapamil (CALAN-SR) 240 MG CR tablet Take 1 tablet (240 mg total) by mouth at bedtime. 03/25/15   Rosalita Chessman, DO    Social History:  reports that he has never smoked. He has never used smokeless tobacco. He reports that he drinks about 4.2 oz of alcohol per week. He reports that he does not use illicit drugs.  Family History  Problem Relation Age of Onset  . Heart disease Mother 35    CHF, stent,   . Cancer Mother 96    breast  . Stroke Father   . Hypertension Father   . Hyperlipidemia Father   . Heart disease Maternal Aunt   . Heart disease Maternal Raenette Rover Weights   08/16/15 0759  Weight: 104.327 kg (230 lb)    All the positives are listed in BOLD  Review of Systems:  HEENT: Headache, blurred vision, runny nose, sore throat Neck: Hypothyroidism, hyperthyroidism,,lymphadenopathy Chest : Shortness of breath, history of COPD, Asthma Heart :  Chest pain, history of coronary arterey disease GI:  Nausea, vomiting, diarrhea, constipation, GERD GU: Dysuria, urgency, frequency of urination, hematuria Neuro: Stroke, seizures, syncope Psych: Depression, anxiety, hallucinations   Physical Exam: Blood pressure 121/70, pulse 68, temperature 98.9 F (37.2 C), temperature source Oral, resp. rate 17, height 5\' 7"  (1.702 m), weight 104.327 kg (230 lb), SpO2 98 %. Constitutional:   Patient is a well-developed and well-nourished male. in no acute distress and cooperative with exam. Head: Normocephalic and atraumatic Mouth: Mucus membranes moist Eyes: PERRL, EOMI, conjunctivae normal, scleral icterus Neck: Supple, No  Thyromegaly Cardiovascular: RRR, S1 normal, S2 normal Pulmonary/Chest: CTAB, no wheezes, rales, or rhonchi Abdominal: Soft. Non-tender, non-distended, bowel sounds are normal, no masses, organomegaly, or guarding present.  Neurological: A&O x3, Strength is normal and symmetric bilaterally, cranial nerve II-XII are grossly intact, no focal motor deficit, sensory intact to light touch bilaterally.  Extremities : No Cyanosis, Clubbing or Edema  Labs on Admission:  Basic Metabolic Panel:  Recent Labs Lab 08/16/15 0838  NA 131*  K 4.0  CL 97*  CO2 20*  GLUCOSE 168*  BUN 55*  CREATININE 5.81*  CALCIUM 8.9   Liver Function Tests:  Recent Labs Lab 08/16/15 0838  AST 47*  ALT 76*  ALKPHOS 198*  BILITOT 4.8*  PROT 6.7  ALBUMIN 2.8*    Recent Labs Lab 08/16/15 0838  LIPASE 50   No results for input(s): AMMONIA in the last 168 hours. CBC:  Recent Labs Lab 08/16/15 0838  WBC 5.8  HGB 10.4*  HCT 31.0*  MCV 74.5*  PLT 193   No results for input(s): GLUCAP in the last 168 hours.  Radiological Exams on Admission: US Abdomen Complete  08/16/2015   CLINICAL DATA:  Acute epigastric abdominal pain  EXAM: ULTRASOUND ABDOMEN COMPLETE  COMPARISON:  06/08/2007  FINDINGS: Gallbladder: No gallstones or wall thickening visualized. No sonographic Murphy sign noted.  Common bile duct: Diameter: 4.8 mm  Liver: Dense heterogeneous increased echotexture compatible with hepatic steatosis. This limits evaluation for focal hepatic abnormality. No intrahepatic biliary dilatation. Patent portal vein with normal hepatopetal flow.  IVC: No abnormality visualized.  Pancreas: Visualized portion unremarkable.  Spleen: Size and appearance within normal limits.  Right Kidney: Length: 10.5 cm. Diffuse cortical atrophy evident. Hypoechoic cysts in the upper pole measuring 16 mm, and in the lower pole measuring 13 mm. No hydronephrosis or obstruction.  Left Kidney: Length: 11.9 cm. Similar cortical  atrophy/thinning. Mild increased echogenicity. No hydronephrosis.  Abdominal aorta: No aneurysm visualized.  Other findings: None.  IMPRESSION: Hepatic steatosis.  No biliary dilatation or acute process.  Negative for gallstones.  Renal cortical atrophy and right renal cysts   Electronically Signed   By: Jerilynn Mages.  Shick M.D.   On: 08/16/2015 11:59    EKG: Independently reviewed.    Assessment/Plan Active Problems:   Acute-on-chronic kidney injury ([place])   AKI (acute kidney injury) ([place])   Jaundice   DM (diabetes mellitus) ([place])   Anemia  Acute on chronic kidney injury Patient presenting with acute on chronic kidney injury, likely combination of poor by mouth intake as well as NSAID use over the past few weeks. We'll start with IV normal saline, will check urine sodium, urine osmolality, abdominal ultrasound only shows hepatic steatosis. Nephrology consultation will be obtained by the ED physician.  Abdominal pain Patient has epigastric abdominal pain, likely from gastritis versus ulcer from chronic NSAID use. I consulted [facility] , Dr. Michail Sermon recommends to keep the patient nothing  by mouth for possible EGD in a.m.  Jaundice Patient has elevated bilirubin, ultrasound abdomen shows hepatic steatosis. Will check PT/INR. Patient has history of alcohol abuse. GI consult.  Diabetes mellitus Continue Lantus, sliding-scale insulin with NovoLog.  Anemia Today's hemoglobin is 10.4, baseline around 14 in March 2015. Stool guaic  negative in the ED. EGD in a.m. to look for possible upper GI cause. Check anemia panel.  Code status: Full code  Family discussion: Admission, patients condition and plan of care including tests being ordered have been discussed with the patient and *his wife at bedside who indicate understanding and agree with the plan and Code Status.   Time Spent on Admission: 60 min  [place] [facility] Pager: [phone] 08/16/2015, 1:09 PM  If 7PM-7AM, please  contact night-coverage  [email or web address]  Password [email or web address]

## 2015-08-16 NOTE — ED Notes (Signed)
Attempted report to 5W. 

## 2015-08-16 NOTE — ED Notes (Addendum)
Patient transported to US 

## 2015-08-16 NOTE — ED Provider Notes (Signed)
CSN: [id number]     Arrival date & time 08/16/15  0744 History   First MD Initiated Contact with Patient 08/16/15 0756     Chief Complaint  Patient presents with  . Abdominal Pain  . Back Pain     (Consider location/radiation/quality/duration/timing/severity/associated sxs/prior Treatment) The history is provided by the patient and medical records. No language interpreter was used.     Rodney [name] is a 49 y.o. male  with a hx of gout, hyperlipidemia, HTN, IDDM, chronic kidney disease presents to the Emergency Department complaining of gradual, persistent, progressively worsening burning epigastric and upper abd pain which radiates into the back onset midnight last night.  Pt reports he has intermittently has similar episodes throughout the week with pain radiating to the back, nausea.  Four days ago he had several episodes of dry heaving and persistent nausea throughout Thursday and Friday with limited PO intake for the last several days.  Associated symptoms include vomiting this morning x3-4 times with a pink/red liquid, but he reports drinking a red Gatorade throughout yesterday.  Pt rates his pain at a 5/10.  He has been taking zantac with temporary relief.  Pt reports the pain with improve, but never resolves.  Pt denies fever, chills, black or tarry stools, diarrhea, weakness, dizziness, syncope.   Pt reports associated dark urine.  Pt has never smoked, drinks beer socially and denies drug usage.  Pt does reports a hx of heavy drinking many years ago.  Pt denies hx of abd surgeries.  Pt denies hx of PUD.    PCP: Augusto Gamble  Past Medical History  Diagnosis Date  . Gout   . Hyperlipidemia   . Hypertension   . Diabetes mellitus     Type 2   History reviewed. No pertinent past surgical history. Family History  Problem Relation Age of Onset  . Heart disease Mother 71    CHF, stent,   . Cancer Mother 55    breast  . Stroke Father   . Hypertension Father   . Hyperlipidemia  Father   . Heart disease Maternal Aunt   . Heart disease Maternal Uncle    Social History  Substance Use Topics  . Smoking status: Never Smoker   . Smokeless tobacco: Never Used  . Alcohol Use: 4.2 oz/week    7 Cans of beer per week    Review of Systems  Constitutional: Negative for fever, diaphoresis, appetite change, fatigue and unexpected weight change.  HENT: Negative for mouth sores.   Eyes: Negative for visual disturbance.  Respiratory: Negative for cough, chest tightness, shortness of breath and wheezing.   Cardiovascular: Negative for chest pain.  Gastrointestinal: Positive for nausea, vomiting and abdominal pain. Negative for diarrhea and constipation.  Endocrine: Negative for polydipsia, polyphagia and polyuria.  Genitourinary: Negative for dysuria, urgency, frequency and hematuria.  Musculoskeletal: Positive for back pain. Negative for neck stiffness.  Skin: Negative for rash.  Allergic/Immunologic: Negative for immunocompromised state.  Neurological: Negative for syncope, light-headedness and headaches.  Hematological: Does not bruise/bleed easily.  Psychiatric/Behavioral: Negative for sleep disturbance. The patient is not nervous/anxious.       Allergies  Simvastatin  Home Medications   Prior to Admission medications   Medication Sig Start Date End Date Taking? Authorizing Provider  aspirin 81 MG tablet Take 81 mg by mouth daily.      Historical Provider, MD  atenolol (TENORMIN) 100 MG tablet Take 0.5 tablets (50 mg total) by mouth daily. 02/10/15  Rosalita Chessman, DO  cyanocobalamin (,VITAMIN B-12,) 1000 MCG/ML injection Inject 1 mL (1,000 mcg total) into the muscle every 30 (thirty) days. 02/10/15   Rosalita Chessman, DO  Insulin Glargine (LANTUS) 100 UNIT/ML Solostar Pen Inject 20 Units into the skin daily. 04/08/15   Rosalita Chessman, DO  losartan-hydrochlorothiazide (HYZAAR) 100-25 MG per tablet Take 1 tablet by mouth daily. 03/25/15   Rosalita Chessman, DO  Omega-3  Fatty Acids (FISH OIL) 1000 MG CAPS Take 2 capsules by mouth 2 (two) times daily.      Historical Provider, MD  SYRINGE/NEEDLE, DISP, 1 ML (BD ECLIPSE SYRINGE) 25G X 5/8" 1 ML MISC 1 mL by Does not apply route every 30 (thirty) days. 06/21/12   Rosalita Chessman, DO  verapamil (CALAN-SR) 240 MG CR tablet Take 1 tablet (240 mg total) by mouth at bedtime. 03/25/15   Yvonne R Lowne, DO   BP 146/93 mmHg  Pulse 72  Temp(Src) 98.9 F (37.2 C) (Oral)  Resp 16  Ht 5\' 7"  (1.702 m)  Wt 230 lb (104.327 kg)  BMI 36.01 kg/m2  SpO2 97% Physical Exam  Constitutional: He appears well-developed and well-nourished. No distress.  Awake, alert, nontoxic appearance  HENT:  Head: Normocephalic and atraumatic.  Mouth/Throat: Oropharynx is clear and moist. No oropharyngeal exudate.  Eyes: Conjunctivae are normal. No scleral icterus.  Neck: Normal range of motion. Neck supple.  Cardiovascular: Normal rate, regular rhythm, normal heart sounds and intact distal pulses.   Pulmonary/Chest: Effort normal and breath sounds normal. No respiratory distress. He has no wheezes.  Equal chest expansion  Abdominal: Soft. Bowel sounds are normal. He exhibits no fluid wave, no abdominal bruit, no ascites, no pulsatile midline mass and no mass. There is tenderness in the epigastric area. There is no rebound, no guarding and no CVA tenderness.  Mild epigastric tenderness without rebound or guarding Abd soft No audible bruit No fluid wave or ascites No pulsatile midline mass  Musculoskeletal: Normal range of motion. He exhibits no edema.  Neurological: He is alert.  Speech is clear and goal oriented Moves extremities without ataxia  Skin: Skin is warm and dry. He is not diaphoretic.  Psychiatric: He has a normal mood and affect.  Nursing note and vitals reviewed.   ED Course  Procedures (including critical care time) Labs Review Labs Reviewed  COMPREHENSIVE METABOLIC PANEL - Abnormal; Notable for the following:    Sodium  131 (*)    Chloride 97 (*)    CO2 20 (*)    Glucose, Bld 168 (*)    BUN 55 (*)    Creatinine, Ser 5.81 (*)    Albumin 2.8 (*)    AST 47 (*)    ALT 76 (*)    Alkaline Phosphatase 198 (*)    Total Bilirubin 4.8 (*)    GFR calc non Af Amer 10 (*)    GFR calc Af Amer 12 (*)    All other components within normal limits  CBC - Abnormal; Notable for the following:    RBC 4.16 (*)    Hemoglobin 10.4 (*)    HCT 31.0 (*)    MCV 74.5 (*)    MCH 25.0 (*)    All other components within normal limits  URINALYSIS, ROUTINE W REFLEX MICROSCOPIC (NOT AT [facility]) - Abnormal; Notable for the following:    Color, Urine AMBER (*)    APPearance HAZY (*)    Hgb urine dipstick LARGE (*)    Bilirubin  Urine MODERATE (*)    Protein, ur >300 (*)    Leukocytes, UA TRACE (*)    All other components within normal limits  URINE MICROSCOPIC-ADD ON - Abnormal; Notable for the following:    Casts GRANULAR CAST (*)    All other components within normal limits  LIPASE, BLOOD  I-STAT TROPOININ, ED  POC OCCULT BLOOD, ED    Imaging Review US Abdomen Complete  08/16/2015   CLINICAL DATA:  Acute epigastric abdominal pain  EXAM: ULTRASOUND ABDOMEN COMPLETE  COMPARISON:  06/08/2007  FINDINGS: Gallbladder: No gallstones or wall thickening visualized. No sonographic Murphy sign noted.  Common bile duct: Diameter: 4.8 mm  Liver: Dense heterogeneous increased echotexture compatible with hepatic steatosis. This limits evaluation for focal hepatic abnormality. No intrahepatic biliary dilatation. Patent portal vein with normal hepatopetal flow.  IVC: No abnormality visualized.  Pancreas: Visualized portion unremarkable.  Spleen: Size and appearance within normal limits.  Right Kidney: Length: 10.5 cm. Diffuse cortical atrophy evident. Hypoechoic cysts in the upper pole measuring 16 mm, and in the lower pole measuring 13 mm. No hydronephrosis or obstruction.  Left Kidney: Length: 11.9 cm. Similar cortical atrophy/thinning. Mild  increased echogenicity. No hydronephrosis.  Abdominal aorta: No aneurysm visualized.  Other findings: None.  IMPRESSION: Hepatic steatosis.  No biliary dilatation or acute process.  Negative for gallstones.  Renal cortical atrophy and right renal cysts   Electronically Signed   By: Jerilynn Mages.  Shick M.D.   On: 08/16/2015 11:59   I have personally reviewed and evaluated these images and lab results as part of my medical decision-making.   EKG Interpretation   Date/Time:  Saturday August 16 2015 07:59:18 EDT Ventricular Rate:  84 PR Interval:  208 QRS Duration: 90 QT Interval:  364 QTC Calculation: 430 R Axis:   21 Text Interpretation:  Sinus rhythm Borderline prolonged PR interval  Confirmed by Alvino Chapel  MD, Ovid Curd [phone]) on 08/16/2015 8:03:48 AM      MDM   Final diagnoses:  Acute-on-chronic kidney injury ([place])  Dehydration  Epigastric abdominal pain  Essential hypertension   Augustin Schooling presents with epigastric pain intermittent for weeks that radiates to the back.  Patient with questionable hematemesis this morning.  We'll begin workup for peptic ulcer disease, possible cholecystitis.  Doubt aortic dissection; patient is comfortable in the bed; no chest pain.  10 AM Labs show worsening of patient's chronic kidney disease. Previous creatinine of 2.8 now up to 5.8.  Elevation in AST and ALT today.  Negative Murphy sign. Continues to pend.  Pt will need admission for acute on chronic kidney injury though this is likely from his decreased PO intake.    12:27 PM Patient with complete resolution of pain after Protonix and oral pain. Ultrasound of abdomen without evidence of cholecystitis.  Will admit for dehydration and worsening creatinine.  BP 146/93 mmHg  Pulse 72  Temp(Src) 98.9 F (37.2 C) (Oral)  Resp 16  Ht 5\' 7"  (1.702 m)  Wt 230 lb (104.327 kg)  BMI 36.01 kg/m2  SpO2 97%   12:36 PM Discussed with Dr. Darrick Meigs who will admit to [facility]. He requests consult with  nephrology.  12:43 PM Discussed with Dr. Justin Mend of nephrology who will consult.    Jarrett Soho Shinita Mac, PA-C 08/16/15 1243  Davonna Belling, MD 08/18/15 [phone]

## 2015-08-16 NOTE — ED Notes (Signed)
PA at bedside.

## 2015-08-16 NOTE — ED Notes (Signed)
Ultrasound called for ETA. Ultrasounds states that they are on their way to this patient.

## 2015-08-16 NOTE — ED Notes (Addendum)
Patient states he has had upper medial abdominal pain that radiates to back. Dry yesterday morning; feels nauseous and vomiting today. States "increased gas." Had "shakes" Wednesday. Pt denies eating due to not feeling well. Denies SOB.

## 2015-08-17 ENCOUNTER — Encounter (HOSPITAL_COMMUNITY)
Admission: EM | Disposition: A | Discharge status: Home / Self Care | Payer: Self-pay | Source: Home / Self Care | Attending: Internal Medicine

## 2015-08-17 ENCOUNTER — Encounter (HOSPITAL_COMMUNITY): Payer: Self-pay

## 2015-08-17 DIAGNOSIS — R1013 Epigastric pain: Secondary | ICD-10-CM | POA: Diagnosis present

## 2015-08-17 DIAGNOSIS — K2970 Gastritis, unspecified, without bleeding: Secondary | ICD-10-CM

## 2015-08-17 HISTORY — PX: ESOPHAGOGASTRODUODENOSCOPY: SHX5428

## 2015-08-17 LAB — COMPREHENSIVE METABOLIC PANEL
ALT: 56 U/L (ref 17–63)
AST: 41 U/L (ref 15–41)
Albumin: 2.3 g/dL — ABNORMAL LOW (ref 3.5–5.0)
Alkaline Phosphatase: 179 U/L — ABNORMAL HIGH (ref 38–126)
Anion gap: 10 (ref 5–15)
BILIRUBIN TOTAL: 3.7 mg/dL — AB (ref 0.3–1.2)
BUN: 48 mg/dL — AB (ref 6–20)
CO2: 19 mmol/L — ABNORMAL LOW (ref 22–32)
CREATININE: 5.03 mg/dL — AB (ref 0.61–1.24)
Calcium: 8.3 mg/dL — ABNORMAL LOW (ref 8.9–10.3)
Chloride: 104 mmol/L (ref 101–111)
GFR calc Af Amer: 14 mL/min — ABNORMAL LOW (ref >60)
GFR, EST NON AFRICAN AMERICAN: 12 mL/min — AB (ref >60)
Glucose, Bld: 106 mg/dL — ABNORMAL HIGH (ref 65–99)
Potassium: 4 mmol/L (ref 3.5–5.1)
Sodium: 133 mmol/L — ABNORMAL LOW (ref 135–145)
TOTAL PROTEIN: 6.1 g/dL — AB (ref 6.5–8.1)

## 2015-08-17 LAB — CBC
HEMATOCRIT: 25.9 % — AB (ref 39.0–52.0)
HEMOGLOBIN: 8.6 g/dL — AB (ref 13.0–17.0)
MCH: 24.9 pg — ABNORMAL LOW (ref 26.0–34.0)
MCHC: 33.2 g/dL (ref 30.0–36.0)
MCV: 74.9 fL — AB (ref 78.0–100.0)
Platelets: 155 10*3/uL (ref 150–400)
RBC: 3.46 MIL/uL — AB (ref 4.22–5.81)
RDW: 15.3 % (ref 11.5–15.5)
WBC: 4.4 10*3/uL (ref 4.0–10.5)

## 2015-08-17 LAB — GLUCOSE, CAPILLARY
GLUCOSE-CAPILLARY: 99 mg/dL (ref 65–99)
GLUCOSE-CAPILLARY: 122 mg/dL — AB (ref 65–99)
GLUCOSE-CAPILLARY: 98 mg/dL (ref 65–99)
Glucose-Capillary: 100 mg/dL — ABNORMAL HIGH (ref 65–99)

## 2015-08-17 SURGERY — EGD (ESOPHAGOGASTRODUODENOSCOPY)
Anesthesia: Moderate Sedation

## 2015-08-17 MED ORDER — PANTOPRAZOLE SODIUM 40 MG IV SOLR
40.0000 mg | INTRAVENOUS | Status: DC
  Administered 2015-08-17 – 2015-08-18 (×2): 40 mg via INTRAVENOUS
  Filled 2015-08-17 (×2): qty 40

## 2015-08-17 MED ORDER — BUTAMBEN-TETRACAINE-BENZOCAINE 2-2-14 % EX AERO
INHALATION_SPRAY | CUTANEOUS | Status: DC | PRN
  Administered 2015-08-17: 2 via TOPICAL

## 2015-08-17 MED ORDER — FENTANYL CITRATE (PF) 100 MCG/2ML IJ SOLN
INTRAMUSCULAR | Status: AC
  Filled 2015-08-17: qty 2

## 2015-08-17 MED ORDER — INSULIN ASPART 100 UNIT/ML ~~LOC~~ SOLN: 0.0000 [IU] | Freq: Every day | SUBCUTANEOUS | Status: DC

## 2015-08-17 MED ORDER — DIPHENHYDRAMINE HCL 50 MG/ML IJ SOLN
INTRAMUSCULAR | Status: AC
  Filled 2015-08-17: qty 1

## 2015-08-17 MED ORDER — SODIUM CHLORIDE 0.9 % IV SOLN: INTRAVENOUS | Status: DC

## 2015-08-17 MED ORDER — INSULIN ASPART 100 UNIT/ML ~~LOC~~ SOLN
0.0000 [IU] | Freq: Three times a day (TID) | SUBCUTANEOUS | Status: DC
  Administered 2015-08-18: 2 [IU] via SUBCUTANEOUS
  Administered 2015-08-19: 3 [IU] via SUBCUTANEOUS
  Administered 2015-08-19 – 2015-08-20 (×3): 2 [IU] via SUBCUTANEOUS

## 2015-08-17 MED ORDER — MIDAZOLAM HCL 10 MG/2ML IJ SOLN
INTRAMUSCULAR | Status: DC | PRN
  Administered 2015-08-17: 2 mg via INTRAVENOUS
  Administered 2015-08-17 (×2): 1 mg via INTRAVENOUS
  Administered 2015-08-17 (×2): 2 mg via INTRAVENOUS

## 2015-08-17 MED ORDER — MIDAZOLAM HCL 5 MG/ML IJ SOLN
INTRAMUSCULAR | Status: AC
  Filled 2015-08-17: qty 2

## 2015-08-17 MED ORDER — DIPHENHYDRAMINE HCL 50 MG/ML IJ SOLN
INTRAMUSCULAR | Status: DC | PRN
  Administered 2015-08-17: 12.5 mg via INTRAVENOUS

## 2015-08-17 MED ORDER — SODIUM CHLORIDE 0.9 % IV SOLN
INTRAVENOUS | Status: DC
  Administered 2015-08-18 – 2015-08-20 (×4): via INTRAVENOUS

## 2015-08-17 MED ORDER — FENTANYL CITRATE (PF) 100 MCG/2ML IJ SOLN
INTRAMUSCULAR | Status: DC | PRN
  Administered 2015-08-17 (×4): 25 ug via INTRAVENOUS

## 2015-08-17 MED ORDER — HYDRALAZINE HCL 20 MG/ML IJ SOLN
2.0000 mg | Freq: Once | INTRAMUSCULAR | Status: AC
  Administered 2015-08-17: 2 mg via INTRAVENOUS
  Filled 2015-08-17: qty 1

## 2015-08-17 NOTE — Consult Note (Signed)
Referring Provider: Dr. Darrick Meigs Primary Care Physician:  Garnet Koyanagi, DO Primary Gastroenterologist:  Althia Forts  Reason for Consultation:  Abdominal pain; Anemia; Nausea and vomiting  HPI: Rodney [name] is a 49 y.o. male with one week of burning epigastric pain that radiated to his back with associated nausea and nonbloody vomitus. Does reports seeing some pink-colored fluid in his vomiting one time prior to admit but report drinking red Gatorade that day. Denies melena or hematochezia. 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 Uses Advil on occasion and denies other NSAIDs. History of alcohol abuse but reports cutting down to 18 beers in a week now. Heme negative. Hgb 10.4 yesterday and today it is 8.6.  Past Medical History  Diagnosis Date  . Gout   . Hyperlipidemia   . Hypertension   . Diabetes mellitus     Type 2    History reviewed. No pertinent past surgical history.  Prior to Admission medications   Medication Sig Start Date End Date Taking? Authorizing Provider  aspirin 81 MG tablet Take 81 mg by mouth daily.     Yes Historical Provider, MD  atenolol (TENORMIN) 100 MG tablet Take 0.5 tablets (50 mg total) by mouth daily. 02/10/15  Yes Yvonne R Lowne, DO  cyanocobalamin (,VITAMIN B-12,) 1000 MCG/ML injection Inject 1 mL (1,000 mcg total) into the muscle every 30 (thirty) days. 02/10/15  Yes Alferd Apa Lowne, DO  Cyanocobalamin (VITAMIN B 12 PO) Take 1,000 mcg by mouth daily.   Yes Historical Provider, MD  losartan-hydrochlorothiazide (HYZAAR) 100-25 MG tablet Take 1 tablet by mouth daily.   Yes Historical Provider, MD  metFORMIN (GLUCOPHAGE-XR) 500 MG 24 hr tablet Take 1,000 mg by mouth daily with breakfast.   Yes Historical Provider, MD  Omega-3 Fatty Acids (FISH OIL) 1000 MG CAPS Take 2 capsules by mouth 2 (two) times daily.     Yes Historical Provider, MD  SYRINGE/NEEDLE, DISP, 1 ML (BD ECLIPSE SYRINGE) 25G X 5/8" 1 ML MISC 1 mL by Does not apply  route every 30 (thirty) days. 06/21/12  Yes Yvonne R Lowne, DO  verapamil (CALAN-SR) 240 MG CR tablet Take 240 mg by mouth at bedtime.   Yes Historical Provider, MD    Scheduled Meds: . atenolol  50 mg Oral Daily  . insulin aspart  0-9 Units Subcutaneous TID WC  . omega-3 acid ethyl esters  2 g Oral BID  . [START ON 08/20/2015] pantoprazole (PROTONIX) IV  40 mg Intravenous Q12H  . verapamil  240 mg Oral QHS   Continuous Infusions: . sodium chloride 1,000 mL (08/16/15 1404)  . pantoprozole (PROTONIX) infusion 8 mg/hr (08/16/15 1531)   PRN Meds:.acetaminophen **OR** acetaminophen  Allergies as of 08/16/2015 - Review Complete 08/16/2015  Allergen Reaction Noted  . Simvastatin  05/20/2008    Family History  Problem Relation Age of Onset  . Heart disease Mother 69    CHF, stent,   . Cancer Mother 56    breast  . Stroke Father   . Hypertension Father   . Hyperlipidemia Father   . Heart disease Maternal Aunt   . Heart disease Maternal Uncle     Social History   Social History  . Marital Status: Married    Spouse Name: N/A  . Number of Children: N/A  . Years of Education: N/A   Occupational History  . self employed    Social History Main Topics  . Smoking status: Never Smoker   .  Smokeless tobacco: Never Used  . Alcohol Use: 4.2 oz/week    7 Cans of beer per week  . Drug Use: No  . Sexual Activity:    Partners: Female   Other Topics Concern  . Not on file   Social History Narrative   Exercise--no    Review of Systems: All negative except as stated above in HPI.  Physical Exam: Vital signs: Filed Vitals:   08/17/15 0855  BP: 203/92  Pulse: 72  Temp: 98.1  Resp: 9   Last BM Date: 08/14/15 General:   Alert,  Well-developed, well-nourished, pleasant and cooperative in NAD Head: atraumatic Eyes: anicteric ENT: mouth, nose normal, oropharynx clear Neck: supple, nontender Lungs:  Clear throughout to auscultation.   No wheezes, crackles, or rhonchi. No  acute distress. Heart:  Regular rate and rhythm; no murmurs, clicks, rubs,  or gallops. Abdomen: soft, nontender, nondistended, +BS  Rectal:  Deferred Ext: pedal pulses intact, no edema Skin: no jaundice  GI:  Lab Results:  Recent Labs  08/16/15 0838 08/17/15 0520  WBC 5.8 4.4  HGB 10.4* 8.6*  HCT 31.0* 25.9*  PLT 193 155   BMET  Recent Labs  08/16/15 0838 08/17/15 0520  NA 131* 133*  K 4.0 4.0  CL 97* 104  CO2 20* 19*  GLUCOSE 168* 106*  BUN 55* 48*  CREATININE 5.81* 5.03*  CALCIUM 8.9 8.3*   LFT  Recent Labs  08/17/15 0520  PROT 6.1*  ALBUMIN 2.3*  AST 41  ALT 56  ALKPHOS 179*  BILITOT 3.7*   PT/INR  Recent Labs  08/16/15 0838  LABPROT 14.0  INR 1.06     Studies/Results: US Abdomen Complete  08/16/2015   CLINICAL DATA:  Acute epigastric abdominal pain  EXAM: ULTRASOUND ABDOMEN COMPLETE  COMPARISON:  06/08/2007  FINDINGS: Gallbladder: No gallstones or wall thickening visualized. No sonographic Murphy sign noted.  Common bile duct: Diameter: 4.8 mm  Liver: Dense heterogeneous increased echotexture compatible with hepatic steatosis. This limits evaluation for focal hepatic abnormality. No intrahepatic biliary dilatation. Patent portal vein with normal hepatopetal flow.  IVC: No abnormality visualized.  Pancreas: Visualized portion unremarkable.  Spleen: Size and appearance within normal limits.  Right Kidney: Length: 10.5 cm. Diffuse cortical atrophy evident. Hypoechoic cysts in the upper pole measuring 16 mm, and in the lower pole measuring 13 mm. No hydronephrosis or obstruction.  Left Kidney: Length: 11.9 cm. Similar cortical atrophy/thinning. Mild increased echogenicity. No hydronephrosis.  Abdominal aorta: No aneurysm visualized.  Other findings: None.  IMPRESSION: Hepatic steatosis.  No biliary dilatation or acute process.  Negative for gallstones.  Renal cortical atrophy and right renal cysts   Electronically Signed   By: Jerilynn Mages.  Shick M.D.   On: 08/16/2015  11:59    Impression/Plan: 49 yo with abdominal pain/N/V that is likely due to his renal dysfunction (uremia) but will do an EGD to look for peptic ulcer disease due to NSAID uses. Anemia likely multifactorial. Elevated bilirubin likely due to his alcohol use vs meds. Doubt alcoholic hepatitis. Lipase normal. Low plts and low albumin and with alcohol history may have underlying cirrhosis. Cannot give IV contrast for CT due to renal issues but if pain persisting then may need a non-contrast CT. EGD now. Supportive care. Protonix drip.    LOS: 1 day   [place] C.  08/17/2015, 11:03 AM  Pager [phone]  If no answer or after 5 PM call [phone]

## 2015-08-17 NOTE — Brief Op Note (Addendum)
Minimal prepyloric gastritis likely due to alcohol use otherwise normal EGD. Abdominal pain/N/V likely due to renal dysfunction. Clear liquid and advance diet as tolerated. Dr. Oletta Lamas available to see this week if needed. Will sign off. Call if questions.

## 2015-08-17 NOTE — Interval H&P Note (Signed)
History and Physical Interval Note:  08/17/2015 11:15 AM  Rodney [name]  has presented today for surgery, with the diagnosis of Abdominal Pain  The various methods of treatment have been discussed with the patient and family. After consideration of risks, benefits and other options for treatment, the patient has consented to  Procedure(s): ESOPHAGOGASTRODUODENOSCOPY (EGD) (N/A) as a surgical intervention .  The patient's history has been reviewed, patient examined, no change in status, stable for surgery.  I have reviewed the patient's chart and labs.  Questions were answered to the patient's satisfaction.     [place] C.

## 2015-08-17 NOTE — Progress Notes (Signed)
[facility] PROGRESS NOTE  Rodney [name] WHQ:[id number] DOB: 1965/12/29 DOA: 08/16/2015 PCP: Garnet Koyanagi, DO  Assessment/Plan: 1. AKI 1. Suspect worsened renal function secondary to recent NSAID use 2. Cr has slightly improved with IVF 3. Will continue hydration as tolerated and follow renal function 2. Abd pain 1. GI was consulted and pt underwent EGD on 10/2. Evidence of gastritis, otherwise normal EGD 2. Per GI, abd pain likely related to renal failure 3. Advancing diet as tolerated 4. Cont PPI 3. DM2 1. Cont on SSI coverage as needed 2. Metformin on hold given renal function 4. Anemia 1. Hgb 8.6 2. Cont to monitor. Pt's stool is heme NEG 5. DVT prophylaxis 1. SCD's  Code Status: Full Family Communication: Pt in room, family at bedside Disposition Plan: pending  Consultants:  GI  Procedures:  EGD 10/2  Antibiotics: Anti-infectives    None      HPI/Subjective: Feels better today. Still reports mild epigastric pain described as "burning."  Objective: Filed Vitals:   08/17/15 1130 08/17/15 1135 08/17/15 1140 08/17/15 1330  BP: 153/64 133/48 129/64 160/85  Pulse: 76 74 77 70  Temp:      TempSrc:      Resp: 10 10 11 18   Height:      Weight:      SpO2: 98% 96% 95% 99%    Intake/Output Summary (Last 24 hours) at 08/17/15 1723 Last data filed at 08/17/15 1353  Gross per 24 hour  Intake    360 ml  Output   1750 ml  Net  -1390 ml   Filed Weights   08/16/15 0759 08/16/15 1403  Weight: 104.327 kg (230 lb) 105 kg (231 lb 7.7 oz)    Exam:   General:  Awake, in nad  Cardiovascular: regular, s1, s2  Respiratory: normal resp effort, no wheezing  Abdomen: soft, nondistended  Musculoskeletal: perfused, no clubbing   Data Reviewed: Basic Metabolic Panel:  Recent Labs Lab 08/16/15 0838 08/17/15 0520  NA 131* 133*  K 4.0 4.0  CL 97* 104  CO2 20* 19*  GLUCOSE 168* 106*  BUN 55* 48*  CREATININE 5.81* 5.03*  CALCIUM 8.9 8.3*   Liver  Function Tests:  Recent Labs Lab 08/16/15 0838 08/17/15 0520  AST 47* 41  ALT 76* 56  ALKPHOS 198* 179*  BILITOT 4.8* 3.7*  PROT 6.7 6.1*  ALBUMIN 2.8* 2.3*    Recent Labs Lab 08/16/15 0838  LIPASE 50   No results for input(s): AMMONIA in the last 168 hours. CBC:  Recent Labs Lab 08/16/15 0838 08/17/15 0520  WBC 5.8 4.4  HGB 10.4* 8.6*  HCT 31.0* 25.9*  MCV 74.5* 74.9*  PLT 193 155   Cardiac Enzymes: No results for input(s): CKTOTAL, CKMB, CKMBINDEX, TROPONINI in the last 168 hours. BNP (last 3 results) No results for input(s): BNP in the last 8760 hours.  ProBNP (last 3 results) No results for input(s): PROBNP in the last 8760 hours.  CBG:  Recent Labs Lab 08/16/15 1716 08/16/15 2129 08/17/15 0744 08/17/15 1204 08/17/15 1702  GLUCAP 123* 119* 99 100* 122*    No results found for this or any previous visit (from the past 240 hour(s)).   Studies: US Abdomen Complete  08/16/2015   CLINICAL DATA:  Acute epigastric abdominal pain  EXAM: ULTRASOUND ABDOMEN COMPLETE  COMPARISON:  06/08/2007  FINDINGS: Gallbladder: No gallstones or wall thickening visualized. No sonographic Murphy sign noted.  Common bile duct: Diameter: 4.8 mm  Liver: Dense heterogeneous increased echotexture  compatible with hepatic steatosis. This limits evaluation for focal hepatic abnormality. No intrahepatic biliary dilatation. Patent portal vein with normal hepatopetal flow.  IVC: No abnormality visualized.  Pancreas: Visualized portion unremarkable.  Spleen: Size and appearance within normal limits.  Right Kidney: Length: 10.5 cm. Diffuse cortical atrophy evident. Hypoechoic cysts in the upper pole measuring 16 mm, and in the lower pole measuring 13 mm. No hydronephrosis or obstruction.  Left Kidney: Length: 11.9 cm. Similar cortical atrophy/thinning. Mild increased echogenicity. No hydronephrosis.  Abdominal aorta: No aneurysm visualized.  Other findings: None.  IMPRESSION: Hepatic steatosis.   No biliary dilatation or acute process.  Negative for gallstones.  Renal cortical atrophy and right renal cysts   Electronically Signed   By: Jerilynn Mages.  Shick M.D.   On: 08/16/2015 11:59    Scheduled Meds: . atenolol  50 mg Oral Daily  . [START ON 08/18/2015] insulin aspart  0-15 Units Subcutaneous TID WC  . insulin aspart  0-5 Units Subcutaneous QHS  . omega-3 acid ethyl esters  2 g Oral BID  . pantoprazole (PROTONIX) IV  40 mg Intravenous Q24H  . verapamil  240 mg Oral QHS   Continuous Infusions: . sodium chloride 1,000 mL (08/16/15 1404)  . sodium chloride 75 mL/hr at 08/17/15 1246    Active Problems:   Acute-on-chronic kidney injury ([place])   AKI (acute kidney injury) ([place])   Jaundice   DM (diabetes mellitus) ([place])   Epigastric abdominal pain   [name], [facility] Pager [phone]. If 7PM-7AM, please contact night-coverage at [email or web address], password [facility] 08/17/2015, 5:23 PM  LOS: 1 day

## 2015-08-17 NOTE — H&P (View-Only) (Signed)
Referring Provider: Dr. Darrick Meigs Primary Care Physician:  Garnet Koyanagi, DO Primary Gastroenterologist:  Althia Forts  Reason for Consultation:  Abdominal pain; Anemia; Nausea and vomiting  HPI: Rodney [name] is a 49 y.o. male with one week of burning epigastric pain that radiated to his back with associated nausea and nonbloody vomitus. Does reports seeing some pink-colored fluid in his vomiting one time prior to admit but report drinking red Gatorade that day. Denies melena or hematochezia. 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 Uses Advil on occasion and denies other NSAIDs. History of alcohol abuse but reports cutting down to 18 beers in a week now. Heme negative. Hgb 10.4 yesterday and today it is 8.6.  Past Medical History  Diagnosis Date  . Gout   . Hyperlipidemia   . Hypertension   . Diabetes mellitus     Type 2    History reviewed. No pertinent past surgical history.  Prior to Admission medications   Medication Sig Start Date End Date Taking? Authorizing Provider  aspirin 81 MG tablet Take 81 mg by mouth daily.     Yes Historical Provider, MD  atenolol (TENORMIN) 100 MG tablet Take 0.5 tablets (50 mg total) by mouth daily. 02/10/15  Yes Yvonne R Lowne, DO  cyanocobalamin (,VITAMIN B-12,) 1000 MCG/ML injection Inject 1 mL (1,000 mcg total) into the muscle every 30 (thirty) days. 02/10/15  Yes Alferd Apa Lowne, DO  Cyanocobalamin (VITAMIN B 12 PO) Take 1,000 mcg by mouth daily.   Yes Historical Provider, MD  losartan-hydrochlorothiazide (HYZAAR) 100-25 MG tablet Take 1 tablet by mouth daily.   Yes Historical Provider, MD  metFORMIN (GLUCOPHAGE-XR) 500 MG 24 hr tablet Take 1,000 mg by mouth daily with breakfast.   Yes Historical Provider, MD  Omega-3 Fatty Acids (FISH OIL) 1000 MG CAPS Take 2 capsules by mouth 2 (two) times daily.     Yes Historical Provider, MD  SYRINGE/NEEDLE, DISP, 1 ML (BD ECLIPSE SYRINGE) 25G X 5/8" 1 ML MISC 1 mL by Does not apply  route every 30 (thirty) days. 06/21/12  Yes Yvonne R Lowne, DO  verapamil (CALAN-SR) 240 MG CR tablet Take 240 mg by mouth at bedtime.   Yes Historical Provider, MD    Scheduled Meds: . atenolol  50 mg Oral Daily  . insulin aspart  0-9 Units Subcutaneous TID WC  . omega-3 acid ethyl esters  2 g Oral BID  . [START ON 08/20/2015] pantoprazole (PROTONIX) IV  40 mg Intravenous Q12H  . verapamil  240 mg Oral QHS   Continuous Infusions: . sodium chloride 1,000 mL (08/16/15 1404)  . pantoprozole (PROTONIX) infusion 8 mg/hr (08/16/15 1531)   PRN Meds:.acetaminophen **OR** acetaminophen  Allergies as of 08/16/2015 - Review Complete 08/16/2015  Allergen Reaction Noted  . Simvastatin  05/20/2008    Family History  Problem Relation Age of Onset  . Heart disease Mother 86    CHF, stent,   . Cancer Mother 41    breast  . Stroke Father   . Hypertension Father   . Hyperlipidemia Father   . Heart disease Maternal Aunt   . Heart disease Maternal Uncle     Social History   Social History  . Marital Status: Married    Spouse Name: N/A  . Number of Children: N/A  . Years of Education: N/A   Occupational History  . self employed    Social History Main Topics  . Smoking status: Never Smoker   .  Smokeless tobacco: Never Used  . Alcohol Use: 4.2 oz/week    7 Cans of beer per week  . Drug Use: No  . Sexual Activity:    Partners: Female   Other Topics Concern  . Not on file   Social History Narrative   Exercise--no    Review of Systems: All negative except as stated above in HPI.  Physical Exam: Vital signs: Filed Vitals:   08/17/15 0855  BP: 203/92  Pulse: 72  Temp: 98.1  Resp: 9   Last BM Date: 08/14/15 General:   Alert,  Well-developed, well-nourished, pleasant and cooperative in NAD Head: atraumatic Eyes: anicteric ENT: mouth, nose normal, oropharynx clear Neck: supple, nontender Lungs:  Clear throughout to auscultation.   No wheezes, crackles, or rhonchi. No  acute distress. Heart:  Regular rate and rhythm; no murmurs, clicks, rubs,  or gallops. Abdomen: soft, nontender, nondistended, +BS  Rectal:  Deferred Ext: pedal pulses intact, no edema Skin: no jaundice  GI:  Lab Results:  Recent Labs  08/16/15 0838 08/17/15 0520  WBC 5.8 4.4  HGB 10.4* 8.6*  HCT 31.0* 25.9*  PLT 193 155   BMET  Recent Labs  08/16/15 0838 08/17/15 0520  NA 131* 133*  K 4.0 4.0  CL 97* 104  CO2 20* 19*  GLUCOSE 168* 106*  BUN 55* 48*  CREATININE 5.81* 5.03*  CALCIUM 8.9 8.3*   LFT  Recent Labs  08/17/15 0520  PROT 6.1*  ALBUMIN 2.3*  AST 41  ALT 56  ALKPHOS 179*  BILITOT 3.7*   PT/INR  Recent Labs  08/16/15 0838  LABPROT 14.0  INR 1.06     Studies/Results: US Abdomen Complete  08/16/2015   CLINICAL DATA:  Acute epigastric abdominal pain  EXAM: ULTRASOUND ABDOMEN COMPLETE  COMPARISON:  06/08/2007  FINDINGS: Gallbladder: No gallstones or wall thickening visualized. No sonographic Murphy sign noted.  Common bile duct: Diameter: 4.8 mm  Liver: Dense heterogeneous increased echotexture compatible with hepatic steatosis. This limits evaluation for focal hepatic abnormality. No intrahepatic biliary dilatation. Patent portal vein with normal hepatopetal flow.  IVC: No abnormality visualized.  Pancreas: Visualized portion unremarkable.  Spleen: Size and appearance within normal limits.  Right Kidney: Length: 10.5 cm. Diffuse cortical atrophy evident. Hypoechoic cysts in the upper pole measuring 16 mm, and in the lower pole measuring 13 mm. No hydronephrosis or obstruction.  Left Kidney: Length: 11.9 cm. Similar cortical atrophy/thinning. Mild increased echogenicity. No hydronephrosis.  Abdominal aorta: No aneurysm visualized.  Other findings: None.  IMPRESSION: Hepatic steatosis.  No biliary dilatation or acute process.  Negative for gallstones.  Renal cortical atrophy and right renal cysts   Electronically Signed   By: Jerilynn Mages.  Shick M.D.   On: 08/16/2015  11:59    Impression/Plan: 49 yo with abdominal pain/N/V that is likely due to his renal dysfunction (uremia) but will do an EGD to look for peptic ulcer disease due to NSAID uses. Anemia likely multifactorial. Elevated bilirubin likely due to his alcohol use vs meds. Doubt alcoholic hepatitis. Lipase normal. Low plts and low albumin and with alcohol history may have underlying cirrhosis. Cannot give IV contrast for CT due to renal issues but if pain persisting then may need a non-contrast CT. EGD now. Supportive care. Protonix drip.    LOS: 1 day   [place] C.  08/17/2015, 11:03 AM  Pager [phone]  If no answer or after 5 PM call [phone]

## 2015-08-17 NOTE — Progress Notes (Signed)
Utilization Review Completed.Alamin M[place] T10/12/2014  

## 2015-08-17 NOTE — Op Note (Signed)
[facility] [place], [id number]   ENDOSCOPY PROCEDURE REPORT  PATIENT: Rodney [name], Rodney [name]  MR#: [id number] BIRTHDATE: 18-Feb-1966 , 56  yrs. old GENDER: male ENDOSCOPIST: Wilford Corner, MD REFERRED BY: PROCEDURE DATE:  August 29, 2015 PROCEDURE:  EGD, diagnostic ASA CLASS:     Class III INDICATIONS:  epigastric pain, vomiting, and nausea. MEDICATIONS: Fentanyl 100 mcg IV, Versed 8 mg IV, and Benadryl 12.5 mg IV TOPICAL ANESTHETIC: Cetacaine Spray X 2  DESCRIPTION OF PROCEDURE: After the risks benefits and alternatives of the procedure were thoroughly explained, informed consent was obtained.  The Pentax Gastroscope [id number] endoscope was introduced through the mouth and advanced to the second portion of the duodenum , Without limitations.  The instrument was slowly withdrawn as the mucosa was fully examined. Estimated blood loss is zero unless otherwise noted in this procedure report.    Esophagus normal. Z-line 40 cm from the incisors. Focal minimal erythema in prepyloric stomach consistent with minimal gastritis otherwise normal-appearing stomach. Duodenal bulb and 2nd portion of the duodenum normal in appearance.       Retroflexed views revealed no abnormalities.     The scope was then withdrawn from the patient and the procedure completed.  COMPLICATIONS: There were no immediate complications.  ENDOSCOPIC IMPRESSION:     Minimal gastritis otherwise normal EGD; Suspect abdominal pain is due to renal dysfunction  RECOMMENDATIONS:     Advance diet; Supportive care; Avoid NSAIDs and alcohol   eSigned:  Wilford Corner, MD 08-29-15 11:43 AM    CC:  CPT CODES: ICD CODES:  The ICD and CPT codes recommended by this software are interpretations from the data that the clinical staff has captured with the software.  The verification of the translation of this report to the ICD and CPT codes and modifiers is the sole responsibility  of the health care institution and practicing physician where this report was generated.  [employer]. will not be held responsible for the validity of the ICD and CPT codes included on this report.  AMA assumes no liability for data contained or not contained herein. CPT is a Designer, television/film set of the [employer].

## 2015-08-18 ENCOUNTER — Inpatient Hospital Stay (HOSPITAL_COMMUNITY): Payer: Self-pay

## 2015-08-18 LAB — CBC
HCT: 26.6 % — ABNORMAL LOW (ref 39.0–52.0)
Hemoglobin: 8.5 g/dL — ABNORMAL LOW (ref 13.0–17.0)
MCH: 23.9 pg — AB (ref 26.0–34.0)
MCHC: 32 g/dL (ref 30.0–36.0)
MCV: 74.9 fL — ABNORMAL LOW (ref 78.0–100.0)
PLATELETS: 202 10*3/uL (ref 150–400)
RBC: 3.55 MIL/uL — ABNORMAL LOW (ref 4.22–5.81)
RDW: 15.3 % (ref 11.5–15.5)
WBC: 4.7 10*3/uL (ref 4.0–10.5)

## 2015-08-18 LAB — COMPREHENSIVE METABOLIC PANEL
ALBUMIN: 2.2 g/dL — AB (ref 3.5–5.0)
ALT: 72 U/L — ABNORMAL HIGH (ref 17–63)
ANION GAP: 7 (ref 5–15)
AST: 68 U/L — ABNORMAL HIGH (ref 15–41)
Alkaline Phosphatase: 226 U/L — ABNORMAL HIGH (ref 38–126)
BUN: 39 mg/dL — ABNORMAL HIGH (ref 6–20)
CALCIUM: 8.1 mg/dL — AB (ref 8.9–10.3)
CO2: 20 mmol/L — AB (ref 22–32)
Chloride: 104 mmol/L (ref 101–111)
Creatinine, Ser: 4.44 mg/dL — ABNORMAL HIGH (ref 0.61–1.24)
GFR calc non Af Amer: 14 mL/min — ABNORMAL LOW (ref >60)
GFR, EST AFRICAN AMERICAN: 16 mL/min — AB (ref >60)
GLUCOSE: 103 mg/dL — AB (ref 65–99)
POTASSIUM: 4 mmol/L (ref 3.5–5.1)
SODIUM: 131 mmol/L — AB (ref 135–145)
Total Bilirubin: 2.8 mg/dL — ABNORMAL HIGH (ref 0.3–1.2)
Total Protein: 5.7 g/dL — ABNORMAL LOW (ref 6.5–8.1)

## 2015-08-18 LAB — GLUCOSE, CAPILLARY
GLUCOSE-CAPILLARY: 130 mg/dL — AB (ref 65–99)
GLUCOSE-CAPILLARY: 111 mg/dL — AB (ref 65–99)
GLUCOSE-CAPILLARY: 149 mg/dL — AB (ref 65–99)
Glucose-Capillary: 107 mg/dL — ABNORMAL HIGH (ref 65–99)

## 2015-08-18 LAB — HEMOGLOBIN A1C
HEMOGLOBIN A1C: 7.6 % — AB (ref 4.8–5.6)
MEAN PLASMA GLUCOSE: 171 mg/dL

## 2015-08-18 MED ORDER — HYDRALAZINE HCL 20 MG/ML IJ SOLN
5.0000 mg | INTRAMUSCULAR | Status: DC | PRN
  Administered 2015-08-18 – 2015-08-19 (×2): 5 mg via INTRAVENOUS
  Filled 2015-08-18 (×3): qty 1

## 2015-08-18 MED ORDER — IOHEXOL 300 MG/ML  SOLN
25.0000 mL | INTRAMUSCULAR | Status: AC
  Administered 2015-08-18 (×2): 25 mL via ORAL

## 2015-08-18 MED ORDER — FERROUS SULFATE 325 (65 FE) MG PO TABS
325.0000 mg | ORAL_TABLET | Freq: Every day | ORAL | Status: DC
  Administered 2015-08-18 – 2015-08-20 (×3): 325 mg via ORAL
  Filled 2015-08-18 (×3): qty 1

## 2015-08-18 MED ORDER — VERAPAMIL HCL ER 180 MG PO TBCR
360.0000 mg | EXTENDED_RELEASE_TABLET | Freq: Every day | ORAL | Status: DC
  Administered 2015-08-18 – 2015-08-19 (×2): 360 mg via ORAL
  Filled 2015-08-18 (×3): qty 2

## 2015-08-18 NOTE — Progress Notes (Signed)
[facility] PROGRESS NOTE  ZAKI GERTSCH DTO:[id number] DOB: [birth year] DOA: 08/16/2015 PCP: Garnet Koyanagi, DO  Assessment/Plan: 1. AKI 1. Suspected worsened renal function secondary to recent NSAID use prior to admit 2. Cr is showing improvement with IVF 3. Will continue hydration as tolerated and follow renal function 2. Abd pain 1. GI was consulted and pt underwent EGD on 10/2. Evidence of gastritis, otherwise normal EGD 2. Per GI, abd pain likely related to renal failure 3. Advancing diet as tolerated 4. Cont PPI 5. abd xray without acute process 3. DM2 1. Glucose thus far stable 2. Cont on SSI coverage as needed 3. Metformin on hold given renal function 4. Anemia 1. Hgb 8.5 and stable 2. Cont to monitor. Pt's stool is heme NEG 3. Pt is iron deficient - will order oral iron 4. In with abd pain and iron deficiency in a 49yo, consider CT abd w/o contrast to eval for masses 5. DVT prophylaxis 1. SCD's  Code Status: Full Family Communication: Pt in room, family at bedside Disposition Plan: pending  Consultants:  GI  Procedures:  EGD 10/2  Antibiotics: Anti-infectives    None      HPI/Subjective: Reports feeling better. Still has some epigastric discomfort  Objective: Filed Vitals:   08/17/15 2331 08/18/15 0042 08/18/15 0514 08/18/15 1351  BP: 177/91 135/80 163/85 185/82  Pulse:   61 60  Temp:   98.5 F (36.9 C) 97.9 F (36.6 C)  TempSrc:   Oral   Resp:   18 18  Height:      Weight:      SpO2:   98% 99%    Intake/Output Summary (Last 24 hours) at 08/18/15 1454 Last data filed at 08/18/15 1437  Gross per 24 hour  Intake 1547.5 ml  Output   3450 ml  Net -1902.5 ml   Filed Weights   08/16/15 0759 08/16/15 1403  Weight: 104.327 kg (230 lb) 105 kg (231 lb 7.7 oz)    Exam:   General:  Awake, in nad, laying in bed  Cardiovascular: regular, s1, s2  Respiratory: normal resp effort, no wheezing  Abdomen: soft,  nondistended  Musculoskeletal: perfused, no clubbing, no cyanosis  Data Reviewed: Basic Metabolic Panel:  Recent Labs Lab 08/16/15 0838 08/17/15 0520 08/18/15 0656  NA 131* 133* 131*  K 4.0 4.0 4.0  CL 97* 104 104  CO2 20* 19* 20*  GLUCOSE 168* 106* 103*  BUN 55* 48* 39*  CREATININE 5.81* 5.03* 4.44*  CALCIUM 8.9 8.3* 8.1*   Liver Function Tests:  Recent Labs Lab 08/16/15 0838 08/17/15 0520 08/18/15 0656  AST 47* 41 68*  ALT 76* 56 72*  ALKPHOS 198* 179* 226*  BILITOT 4.8* 3.7* 2.8*  PROT 6.7 6.1* 5.7*  ALBUMIN 2.8* 2.3* 2.2*    Recent Labs Lab 08/16/15 0838  LIPASE 50   No results for input(s): AMMONIA in the last 168 hours. CBC:  Recent Labs Lab 08/16/15 0838 08/17/15 0520 08/18/15 0656  WBC 5.8 4.4 4.7  HGB 10.4* 8.6* 8.5*  HCT 31.0* 25.9* 26.6*  MCV 74.5* 74.9* 74.9*  PLT 193 155 202   Cardiac Enzymes: No results for input(s): CKTOTAL, CKMB, CKMBINDEX, TROPONINI in the last 168 hours. BNP (last 3 results) No results for input(s): BNP in the last 8760 hours.  ProBNP (last 3 results) No results for input(s): PROBNP in the last 8760 hours.  CBG:  Recent Labs Lab 08/17/15 1204 08/17/15 1702 08/17/15 2110 08/18/15 0753 08/18/15 1147  GLUCAP 100*  122* 98 107* 130*    No results found for this or any previous visit (from the past 240 hour(s)).   Studies: Dg Abd Portable 1v  08/18/2015   CLINICAL DATA:  Abdominal pain for 3 days  EXAM: PORTABLE ABDOMEN - 1 VIEW  COMPARISON:  None.  FINDINGS: Scattered large and small bowel gas is noted. No obstructive changes are seen. No free air is noted. No abnormal mass or abnormal calcifications are noted.  IMPRESSION: No acute abnormality seen.   Electronically Signed   By: Inez Catalina M.D.   On: 08/18/2015 11:48    Scheduled Meds: . atenolol  50 mg Oral Daily  . ferrous sulfate  325 mg Oral Q breakfast  . insulin aspart  0-15 Units Subcutaneous TID WC  . insulin aspart  0-5 Units Subcutaneous  QHS  . omega-3 acid ethyl esters  2 g Oral BID  . pantoprazole (PROTONIX) IV  40 mg Intravenous Q24H  . verapamil  360 mg Oral QHS   Continuous Infusions: . sodium chloride 100 mL/hr at 08/18/15 1305    Principal Problem:   AKI (acute kidney injury) ([place]) Active Problems:   Essential hypertension   Acute-on-chronic kidney injury ([place])   Jaundice   DM (diabetes mellitus) ([place])   Epigastric abdominal pain   Gastritis and gastroduodenitis   Jaz Laningham, [facility] Pager [phone]. If 7PM-7AM, please contact night-coverage at [email or web address], password [facility] 08/18/2015, 2:54 PM  LOS: 2 days

## 2015-08-18 NOTE — Care Management Note (Signed)
Case Management Note  Patient Details  Name: CEJAY CAMBRE MRN: [id number] Date of Birth: [birth year]  Subjective/Objective:     Date: 08/18/15 Spoke with patient at the bedside along with wife. Introduced self as Tourist information centre manager and explained role in discharge planning and how to be reached. Verified patient lives in town, alone with spouse . Expressed no  potential need for no other DME. Verified patient anticipates to go home with family at time of discharge and will have full-time supervision by family  at this time to best of their knowledge. Patient confirmed  needing help with their medication. Patient drives to MD appointments. Verified patient has PCP [place], but has no insurance at this time and is looking for another pcp.  Will have follow up at the [facility] clinic.    Plan: CM will continue to follow for discharge planning and [facility] resources.                Action/Plan:   Expected Discharge Date:                  Expected Discharge Plan:  Home/Self Care  In-House Referral:     Discharge planning Services  CM Consult, Medication Assistance, Follow-up appt scheduled, [facility]  Post Acute Care Choice:    Choice offered to:     DME Arranged:    DME Agency:     HH Arranged:    HH Agency:     Status of Service:  In process, will continue to follow  Medicare Important Message Given:    Date Medicare IM Given:    Medicare IM give by:    Date Additional Medicare IM Given:    Additional Medicare Important Message give by:     If discussed at [place] of Stay Meetings, dates discussed:    Additional Comments:  Zenon Mayo, RN 08/18/2015, 3:27 PM

## 2015-08-19 ENCOUNTER — Encounter (HOSPITAL_COMMUNITY): Payer: Self-pay | Admitting: Gastroenterology

## 2015-08-19 LAB — COMPREHENSIVE METABOLIC PANEL
ALT: 80 U/L — ABNORMAL HIGH (ref 17–63)
ANION GAP: 7 (ref 5–15)
AST: 76 U/L — AB (ref 15–41)
Albumin: 2.3 g/dL — ABNORMAL LOW (ref 3.5–5.0)
Alkaline Phosphatase: 247 U/L — ABNORMAL HIGH (ref 38–126)
BUN: 35 mg/dL — ABNORMAL HIGH (ref 6–20)
CHLORIDE: 106 mmol/L (ref 101–111)
CO2: 21 mmol/L — ABNORMAL LOW (ref 22–32)
Calcium: 8.1 mg/dL — ABNORMAL LOW (ref 8.9–10.3)
Creatinine, Ser: 4.1 mg/dL — ABNORMAL HIGH (ref 0.61–1.24)
GFR, EST AFRICAN AMERICAN: 18 mL/min — AB (ref >60)
GFR, EST NON AFRICAN AMERICAN: 15 mL/min — AB (ref >60)
Glucose, Bld: 99 mg/dL (ref 65–99)
POTASSIUM: 3.9 mmol/L (ref 3.5–5.1)
Sodium: 134 mmol/L — ABNORMAL LOW (ref 135–145)
TOTAL PROTEIN: 5.6 g/dL — AB (ref 6.5–8.1)
Total Bilirubin: 1.9 mg/dL — ABNORMAL HIGH (ref 0.3–1.2)

## 2015-08-19 LAB — LIPASE, BLOOD: LIPASE: 86 U/L — AB (ref 22–51)

## 2015-08-19 LAB — GLUCOSE, CAPILLARY
GLUCOSE-CAPILLARY: 101 mg/dL — AB (ref 65–99)
GLUCOSE-CAPILLARY: 139 mg/dL — AB (ref 65–99)
Glucose-Capillary: 175 mg/dL — ABNORMAL HIGH (ref 65–99)
Glucose-Capillary: 164 mg/dL — ABNORMAL HIGH (ref 65–99)

## 2015-08-19 MED ORDER — BISMUTH SUBSALICYLATE 262 MG/15ML PO SUSP
30.0000 mL | ORAL | Status: DC | PRN
Start: 2015-08-19 — End: 2015-08-21
  Filled 2015-08-19: qty 118

## 2015-08-19 MED ORDER — GI COCKTAIL ~~LOC~~: 30.0000 mL | Freq: Three times a day (TID) | ORAL | Status: DC | PRN

## 2015-08-19 MED ORDER — PANTOPRAZOLE SODIUM 40 MG PO TBEC
40.0000 mg | DELAYED_RELEASE_TABLET | Freq: Every day | ORAL | Status: DC
  Administered 2015-08-19 – 2015-08-20 (×2): 40 mg via ORAL
  Filled 2015-08-19 (×2): qty 1

## 2015-08-19 MED ORDER — ISOSORB DINITRATE-HYDRALAZINE 20-37.5 MG PO TABS
1.0000 | ORAL_TABLET | Freq: Two times a day (BID) | ORAL | Status: DC
Start: 2015-08-19 — End: 2015-08-21
  Administered 2015-08-19 – 2015-08-20 (×3): 1 via ORAL
  Filled 2015-08-19 (×3): qty 1

## 2015-08-19 MED ORDER — HYDRALAZINE HCL 20 MG/ML IJ SOLN
10.0000 mg | INTRAMUSCULAR | Status: DC | PRN
  Filled 2015-08-19: qty 1

## 2015-08-19 NOTE — Progress Notes (Addendum)
[facility] PROGRESS NOTE  Rodney [name] NLZ:[id number] DOB: July 17, 1966 DOA: 08/16/2015 PCP: Garnet Koyanagi, DO   Off Service Summary: [phone] with hx of CKD presents with abd pain and ARF. Pt has been seen by GI and is s/p unremarable EGD. Abd pain likely secondary to renal failure. Renal function is improving with IVF hydration.   Assessment/Plan: 1. AKI 1. Suspected worsened renal function secondary to recent NSAID use with dehydration prior to admit 2. Cr is showing improvement with IVF 3. Will continue hydration as tolerated and follow renal function 4. Recent baseline Cr seems to be just under 3 2. Abd pain 1. GI was initially consulted and pt underwent EGD on 10/2. Evidence of gastritis, otherwise normal EGD 2. Per GI, abd pain likely related to renal failure 3. Advancing diet as tolerated 4. Cont PPI 5. abd xray without acute process 6. CT abd with no acute processes 3. DM2 1. Glucose thus far stable 2. Cont on SSI coverage as needed 3. Metformin on hold given poor renal function. 4. Iron deficiency Anemia 1. Hgb 8.5 and stable 2. Cont to monitor. Pt's stool is heme NEG 3. Pt is iron deficient - will order oral iron 5. DVT prophylaxis 1. SCD's  Code Status: Full Family Communication: Pt in room, family at bedside Disposition Plan: pending  Consultants:  GI  Procedures:  EGD 10/2  Antibiotics: Anti-infectives    None      HPI/Subjective: Still has mild epigastric pain but seems improved today  Objective: Filed Vitals:   08/19/15 1244 08/19/15 1337 08/19/15 1656 08/19/15 1658  BP: 171/83 177/86 140/75 140/75  Pulse: 63 62 64 63  Temp:  98.5 F (36.9 C)    TempSrc:  Oral    Resp:  18    Height:      Weight:      SpO2: 96% 98%  98%    Intake/Output Summary (Last 24 hours) at 08/19/15 1803 Last data filed at 08/19/15 1634  Gross per 24 hour  Intake   3130 ml  Output   3850 ml  Net   -720 ml   Filed Weights   08/16/15 0759 08/16/15 1403   Weight: 104.327 kg (230 lb) 105 kg (231 lb 7.7 oz)    Exam:   General:  Awake, in nad, laying in bed  Cardiovascular: regular, s1, s2  Respiratory: normal resp effort, no wheezing  Abdomen: soft, nondistended  Musculoskeletal: perfused, no clubbing, no cyanosis  Data Reviewed: Basic Metabolic Panel:  Recent Labs Lab 08/16/15 0838 08/17/15 0520 08/18/15 0656 08/19/15 0608  NA 131* 133* 131* 134*  K 4.0 4.0 4.0 3.9  CL 97* 104 104 106  CO2 20* 19* 20* 21*  GLUCOSE 168* 106* 103* 99  BUN 55* 48* 39* 35*  CREATININE 5.81* 5.03* 4.44* 4.10*  CALCIUM 8.9 8.3* 8.1* 8.1*   Liver Function Tests:  Recent Labs Lab 08/16/15 0838 08/17/15 0520 08/18/15 0656 08/19/15 0608  AST 47* 41 68* 76*  ALT 76* 56 72* 80*  ALKPHOS 198* 179* 226* 247*  BILITOT 4.8* 3.7* 2.8* 1.9*  PROT 6.7 6.1* 5.7* 5.6*  ALBUMIN 2.8* 2.3* 2.2* 2.3*    Recent Labs Lab 08/16/15 0838 08/19/15 0608  LIPASE 50 86*   No results for input(s): AMMONIA in the last 168 hours. CBC:  Recent Labs Lab 08/16/15 0838 08/17/15 0520 08/18/15 0656  WBC 5.8 4.4 4.7  HGB 10.4* 8.6* 8.5*  HCT 31.0* 25.9* 26.6*  MCV 74.5* 74.9* 74.9*  PLT  193 155 202   Cardiac Enzymes: No results for input(s): CKTOTAL, CKMB, CKMBINDEX, TROPONINI in the last 168 hours. BNP (last 3 results) No results for input(s): BNP in the last 8760 hours.  ProBNP (last 3 results) No results for input(s): PROBNP in the last 8760 hours.  CBG:  Recent Labs Lab 08/18/15 1702 08/18/15 2111 08/19/15 0751 08/19/15 1133 08/19/15 1638  GLUCAP 111* 149* 101* 139* 175*    No results found for this or any previous visit (from the past 240 hour(s)).   Studies: Ct Abdomen Pelvis Wo Contrast  08/19/2015   CLINICAL DATA:  Epigastric pain for 9 days  EXAM: CT ABDOMEN AND PELVIS WITHOUT CONTRAST  TECHNIQUE: Multidetector CT imaging of the abdomen and pelvis was performed following the standard protocol without IV contrast.   COMPARISON:  08/16/2015  FINDINGS: Lung bases demonstrate some minimal dependent atelectasis. No focal confluent infiltrate is seen.  The liver, gallbladder, spleen, adrenal glands and pancreas are within normal limits. The fatty changes of the liver are not well appreciated on this exam. The kidneys demonstrate a vague hypodensities consistent with a cortical cysts seen previously. No obstructive changes in the large and small bowel are noted. The appendix is within normal limits. Scattered diverticular change is seen without diverticulitis.  The bladder is well distended. No pelvic mass lesion or sidewall abnormality is noted. No significant lymphadenopathy is seen. No acute bony abnormality is seen.  IMPRESSION: Chronic changes as described above.  No acute abnormality is noted.   Electronically Signed   By: Inez Catalina M.D.   On: 08/19/2015 07:48   Dg Abd Portable 1v  08/18/2015   CLINICAL DATA:  Abdominal pain for 3 days  EXAM: PORTABLE ABDOMEN - 1 VIEW  COMPARISON:  None.  FINDINGS: Scattered large and small bowel gas is noted. No obstructive changes are seen. No free air is noted. No abnormal mass or abnormal calcifications are noted.  IMPRESSION: No acute abnormality seen.   Electronically Signed   By: Inez Catalina M.D.   On: 08/18/2015 11:48    Scheduled Meds: . atenolol  50 mg Oral Daily  . ferrous sulfate  325 mg Oral Q breakfast  . insulin aspart  0-15 Units Subcutaneous TID WC  . insulin aspart  0-5 Units Subcutaneous QHS  . isosorbide-hydrALAZINE  1 tablet Oral BID  . omega-3 acid ethyl esters  2 g Oral BID  . pantoprazole  40 mg Oral Daily  . verapamil  360 mg Oral QHS   Continuous Infusions: . sodium chloride 100 mL/hr at 08/19/15 1655    Principal Problem:   AKI (acute kidney injury) ([place]) Active Problems:   Essential hypertension   Acute-on-chronic kidney injury ([place])   Jaundice   DM (diabetes mellitus) ([place])   Epigastric abdominal pain   Gastritis and  gastroduodenitis   Pharaoh Pio, [facility] Pager [phone]. If 7PM-7AM, please contact night-coverage at [email or web address], password [facility] 08/19/2015, 6:03 PM  LOS: 3 days

## 2015-08-20 DIAGNOSIS — R1013 Epigastric pain: Secondary | ICD-10-CM

## 2015-08-20 DIAGNOSIS — N189 Chronic kidney disease, unspecified: Secondary | ICD-10-CM

## 2015-08-20 DIAGNOSIS — R17 Unspecified jaundice: Secondary | ICD-10-CM

## 2015-08-20 DIAGNOSIS — K2970 Gastritis, unspecified, without bleeding: Secondary | ICD-10-CM

## 2015-08-20 DIAGNOSIS — R7989 Other specified abnormal findings of blood chemistry: Secondary | ICD-10-CM

## 2015-08-20 DIAGNOSIS — R945 Abnormal results of liver function studies: Secondary | ICD-10-CM

## 2015-08-20 DIAGNOSIS — I10 Essential (primary) hypertension: Secondary | ICD-10-CM

## 2015-08-20 DIAGNOSIS — K2990 Gastroduodenitis, unspecified, without bleeding: Secondary | ICD-10-CM

## 2015-08-20 DIAGNOSIS — N179 Acute kidney failure, unspecified: Secondary | ICD-10-CM

## 2015-08-20 LAB — CBC
HCT: 24.6 % — ABNORMAL LOW (ref 39.0–52.0)
HEMOGLOBIN: 8.1 g/dL — AB (ref 13.0–17.0)
MCH: 24.8 pg — AB (ref 26.0–34.0)
MCHC: 32.9 g/dL (ref 30.0–36.0)
MCV: 75.5 fL — AB (ref 78.0–100.0)
PLATELETS: 217 10*3/uL (ref 150–400)
RBC: 3.26 MIL/uL — ABNORMAL LOW (ref 4.22–5.81)
RDW: 15.9 % — ABNORMAL HIGH (ref 11.5–15.5)
WBC: 6.6 10*3/uL (ref 4.0–10.5)

## 2015-08-20 LAB — HEPATIC FUNCTION PANEL
ALT: 81 U/L — AB (ref 17–63)
AST: 72 U/L — ABNORMAL HIGH (ref 15–41)
Albumin: 2.4 g/dL — ABNORMAL LOW (ref 3.5–5.0)
Alkaline Phosphatase: 230 U/L — ABNORMAL HIGH (ref 38–126)
BILIRUBIN DIRECT: 0.7 mg/dL — AB (ref 0.1–0.5)
BILIRUBIN INDIRECT: 0.7 mg/dL (ref 0.3–0.9)
BILIRUBIN TOTAL: 1.4 mg/dL — AB (ref 0.3–1.2)
Total Protein: 5.6 g/dL — ABNORMAL LOW (ref 6.5–8.1)

## 2015-08-20 LAB — BASIC METABOLIC PANEL
Anion gap: 10 (ref 5–15)
BUN: 31 mg/dL — ABNORMAL HIGH (ref 6–20)
CALCIUM: 8.1 mg/dL — AB (ref 8.9–10.3)
CO2: 18 mmol/L — AB (ref 22–32)
CREATININE: 3.6 mg/dL — AB (ref 0.61–1.24)
Chloride: 107 mmol/L (ref 101–111)
GFR calc Af Amer: 21 mL/min — ABNORMAL LOW (ref >60)
GFR calc non Af Amer: 18 mL/min — ABNORMAL LOW (ref >60)
GLUCOSE: 103 mg/dL — AB (ref 65–99)
Potassium: 3.8 mmol/L (ref 3.5–5.1)
Sodium: 135 mmol/L (ref 135–145)

## 2015-08-20 LAB — GLUCOSE, CAPILLARY
GLUCOSE-CAPILLARY: 107 mg/dL — AB (ref 65–99)
Glucose-Capillary: 124 mg/dL — ABNORMAL HIGH (ref 65–99)
Glucose-Capillary: 129 mg/dL — ABNORMAL HIGH (ref 65–99)

## 2015-08-20 MED ORDER — PANTOPRAZOLE SODIUM 40 MG PO TBEC: 40.0000 mg | DELAYED_RELEASE_TABLET | Freq: Every day | ORAL | Status: DC

## 2015-08-20 MED ORDER — ISOSORBIDE MONONITRATE ER 30 MG PO TB24: 30.0000 mg | ORAL_TABLET | Freq: Every day | ORAL | Status: DC

## 2015-08-20 MED ORDER — OMEPRAZOLE 40 MG PO CPDR: 40.0000 mg | DELAYED_RELEASE_CAPSULE | Freq: Every day | ORAL | Status: AC

## 2015-08-20 MED ORDER — HYDRALAZINE HCL 50 MG PO TABS: 10.0000 mg | ORAL_TABLET | Freq: Three times a day (TID) | ORAL | Status: DC

## 2015-08-20 MED ORDER — FERROUS SULFATE 325 (65 FE) MG PO TABS: 325.0000 mg | ORAL_TABLET | Freq: Every day | ORAL | Status: AC

## 2015-08-20 MED ORDER — HYDRALAZINE HCL 50 MG PO TABS: 50.0000 mg | ORAL_TABLET | Freq: Three times a day (TID) | ORAL | Status: AC

## 2015-08-20 NOTE — Discharge Summary (Signed)
Physician Discharge Summary  Rodney [name] HYQ:[id number] DOB: [birth year] DOA: 08/16/2015  PCP: Will follow-up with the sickle cell clinic who will serve as his PCP for now  Admit date: 08/16/2015 Discharge date: 08/20/2015  Time spent: 60 minutes  Recommendations for Outpatient Follow-up:  1. Complete metabolic panel at follow-up visit on 10/10  Discharge Condition: Stable  Discharge Diagnoses:  Principal Problem:   Acute-on-chronic kidney injury ([place]) Active Problems:   Essential hypertension   Jaundice   DM (diabetes mellitus) ([place])   Gastritis and gastroduodenitis   Elevated LFTs/fatty liver   History of present illness:  Rodney [name] is a 49 y.o. male with chronic kidney disease stage III, essential hypertension, diabetes mellitus, dyslipidemia, alcohol use presented to the hospital for upper abdominal pain for 1 week associated with nausea and vomiting. He admitted to multiple episodes of vomiting some of which were blood-tinged. He admitted to frequent NSAID use. In the ER he was found to have acute on chronic renal failure, elevated LFTs with jaundice and was admitted for further management of this and his abdominal pain.   Hospital Course:  Principal Problem:  AKI (acute kidney injury) ([place]) -Possibly prerenal and ATN in relation to hypovolemia from nausea and vomiting, ARB and HCTZ- possibly also NSAID use -Baseline creatinine about 2.8-presented with a creatinine of 5.81 which has gradually improved to 3.6 -Patient will need a repeat metabolic panel on his follow-up visit his PCP which is in 5 days- avoid nephrotoxins -Holding ARB, HCTZ and metformin  Active Problems:  Epigastric pain-nausea and vomiting- presenting complaint - EGD 10/2 reveals prepyloric gastritis suspected to be secondary to alcohol use-he admits to drinking about 12 beers a week which she states he will now quit -Abdominal pain has resolved completely -Limit NSAIDs and continue daily PPI for  now- will use Prilosec as he has no insurance -Had some blood-tinged vomiting on admission and aspirin was held-continue to hold aspirin for now- no history of MI or TIA   Essential hypertension -Prescribed losartan/HCTZ, verapamil and atenolol -ARB and HCTZ on hold -Continue isosorbide, hydralazine, atenolol and verapamil  Elevated LFTs - Fatty liver -LFTs remaining steady for now-  follow-up as outpatient   DM (diabetes mellitus) ([place]) -Prescribed metformin as outpatient which is on hold due to acute renal failure -A1c 7.6 reveals relatively reasonable control  Anemia of chronic disease and possibly also chronic blood loss with low iron levels -Agree with iron replacement at this time - monitor for GI side effects  Consultations:  GI-Dr. Michail Sermon  Discharge Exam: Filed Weights   08/16/15 0759 08/16/15 1403  Weight: 104.327 kg (230 lb) 105 kg (231 lb 7.7 oz)   Filed Vitals:   08/20/15 0549  BP: 156/79  Pulse: 67  Temp: 98.2 F (36.8 C)  Resp: 17    General: AAO x 3, no distress Cardiovascular: RRR, no murmurs  Respiratory: clear to auscultation bilaterally GI: soft, non-tender, non-distended, bowel sound positive  Discharge Instructions You were cared for by a hospitalist during your hospital stay. If you have any questions about your discharge medications or the care you received while you were in the hospital after you are discharged, you can call the unit and asked to speak with the hospitalist on call if the hospitalist that took care of you is not available. Once you are discharged, your primary care physician will handle any further medical issues. Please note that NO REFILLS for any discharge medications will be authorized once you are discharged, as  it is imperative that you return to your primary care physician (or establish a relationship with a primary care physician if you do not have one) for your aftercare needs so that they can reassess your need for  medications and monitor your lab values.     Medication List    STOP taking these medications        aspirin 81 MG tablet     losartan-hydrochlorothiazide 100-25 MG tablet  Commonly known as:  HYZAAR     metFORMIN 500 MG 24 hr tablet  Commonly known as:  GLUCOPHAGE-XR      TAKE these medications        atenolol 100 MG tablet  Commonly known as:  TENORMIN  Take 0.5 tablets (50 mg total) by mouth daily.     VITAMIN B 12 PO  Take 1,000 mcg by mouth daily.     cyanocobalamin 1000 MCG/ML injection  Commonly known as:  (VITAMIN B-12)  Inject 1 mL (1,000 mcg total) into the muscle every 30 (thirty) days.     ferrous sulfate 325 (65 FE) MG tablet  Take 1 tablet (325 mg total) by mouth daily with breakfast.     Fish Oil 1000 MG Caps  Take 2 capsules by mouth 2 (two) times daily.     hydrALAZINE 50 MG tablet  Commonly known as:  APRESOLINE  Take 1 tablet (50 mg total) by mouth 3 (three) times daily.     isosorbide mononitrate 30 MG 24 hr tablet  Commonly known as:  IMDUR  Take 1 tablet (30 mg total) by mouth daily.     omeprazole 40 MG capsule  Commonly known as:  PRILOSEC  Take 1 capsule (40 mg total) by mouth daily.     SYRINGE/NEEDLE (DISP) 1 ML 25G X 5/8" 1 ML Misc  Commonly known as:  BD ECLIPSE SYRINGE  1 mL by Does not apply route every 30 (thirty) days.     verapamil 240 MG CR tablet  Commonly known as:  CALAN-SR  Take 240 mg by mouth at bedtime.       Allergies  Allergen Reactions  . Simvastatin     REACTION: muscle aches   Follow-up Information    Follow up with [facility] On 08/25/2015.   Specialty:  Internal Medicine   Why:  1:30 pm for hospital follow up( sickle cell clinic taking overflow for [facility])   Contact information:   [place] [phone]       The results of significant diagnostics from this hospitalization (including imaging, microbiology, ancillary and laboratory)  are listed below for reference.    Significant Diagnostic Studies: Ct Abdomen Pelvis Wo Contrast  08/19/2015   CLINICAL DATA:  Epigastric pain for 9 days  EXAM: CT ABDOMEN AND PELVIS WITHOUT CONTRAST  TECHNIQUE: Multidetector CT imaging of the abdomen and pelvis was performed following the standard protocol without IV contrast.  COMPARISON:  08/16/2015  FINDINGS: Lung bases demonstrate some minimal dependent atelectasis. No focal confluent infiltrate is seen.  The liver, gallbladder, spleen, adrenal glands and pancreas are within normal limits. The fatty changes of the liver are not well appreciated on this exam. The kidneys demonstrate a vague hypodensities consistent with a cortical cysts seen previously. No obstructive changes in the large and small bowel are noted. The appendix is within normal limits. Scattered diverticular change is seen without diverticulitis.  The bladder is well distended. No pelvic mass lesion  or sidewall abnormality is noted. No significant lymphadenopathy is seen. No acute bony abnormality is seen.  IMPRESSION: Chronic changes as described above.  No acute abnormality is noted.   Electronically Signed   By: Inez Catalina M.D.   On: 08/19/2015 07:48   US Abdomen Complete  08/16/2015   CLINICAL DATA:  Acute epigastric abdominal pain  EXAM: ULTRASOUND ABDOMEN COMPLETE  COMPARISON:  06/08/2007  FINDINGS: Gallbladder: No gallstones or wall thickening visualized. No sonographic Murphy sign noted.  Common bile duct: Diameter: 4.8 mm  Liver: Dense heterogeneous increased echotexture compatible with hepatic steatosis. This limits evaluation for focal hepatic abnormality. No intrahepatic biliary dilatation. Patent portal vein with normal hepatopetal flow.  IVC: No abnormality visualized.  Pancreas: Visualized portion unremarkable.  Spleen: Size and appearance within normal limits.  Right Kidney: Length: 10.5 cm. Diffuse cortical atrophy evident. Hypoechoic cysts in the upper pole measuring  16 mm, and in the lower pole measuring 13 mm. No hydronephrosis or obstruction.  Left Kidney: Length: 11.9 cm. Similar cortical atrophy/thinning. Mild increased echogenicity. No hydronephrosis.  Abdominal aorta: No aneurysm visualized.  Other findings: None.  IMPRESSION: Hepatic steatosis.  No biliary dilatation or acute process.  Negative for gallstones.  Renal cortical atrophy and right renal cysts   Electronically Signed   By: Jerilynn Mages.  Shick M.D.   On: 08/16/2015 11:59   Dg Abd Portable 1v  08/18/2015   CLINICAL DATA:  Abdominal pain for 3 days  EXAM: PORTABLE ABDOMEN - 1 VIEW  COMPARISON:  None.  FINDINGS: Scattered large and small bowel gas is noted. No obstructive changes are seen. No free air is noted. No abnormal mass or abnormal calcifications are noted.  IMPRESSION: No acute abnormality seen.   Electronically Signed   By: Inez Catalina M.D.   On: 08/18/2015 11:48    Microbiology: No results found for this or any previous visit (from the past 240 hour(s)).   Labs: Basic Metabolic Panel:  Recent Labs Lab 08/16/15 0838 08/17/15 0520 08/18/15 0656 08/19/15 0608 08/20/15 0623  NA 131* 133* 131* 134* 135  K 4.0 4.0 4.0 3.9 3.8  CL 97* 104 104 106 107  CO2 20* 19* 20* 21* 18*  GLUCOSE 168* 106* 103* 99 103*  BUN 55* 48* 39* 35* 31*  CREATININE 5.81* 5.03* 4.44* 4.10* 3.60*  CALCIUM 8.9 8.3* 8.1* 8.1* 8.1*   Liver Function Tests:  Recent Labs Lab 08/16/15 5176 08/17/15 0520 08/18/15 0656 08/19/15 0608 08/20/15 0623  AST 47* 41 68* 76* 72*  ALT 76* 56 72* 80* 81*  ALKPHOS 198* 179* 226* 247* 230*  BILITOT 4.8* 3.7* 2.8* 1.9* 1.4*  PROT 6.7 6.1* 5.7* 5.6* 5.6*  ALBUMIN 2.8* 2.3* 2.2* 2.3* 2.4*    Recent Labs Lab 08/16/15 0838 08/19/15 0608  LIPASE 50 86*   No results for input(s): AMMONIA in the last 168 hours. CBC:  Recent Labs Lab 08/16/15 0838 08/17/15 0520 08/18/15 0656 08/20/15 0623  WBC 5.8 4.4 4.7 6.6  HGB 10.4* 8.6* 8.5* 8.1*  HCT 31.0* 25.9* 26.6*  24.6*  MCV 74.5* 74.9* 74.9* 75.5*  PLT 193 155 202 217   Cardiac Enzymes: No results for input(s): CKTOTAL, CKMB, CKMBINDEX, TROPONINI in the last 168 hours. BNP: BNP (last 3 results) No results for input(s): BNP in the last 8760 hours.  ProBNP (last 3 results) No results for input(s): PROBNP in the last 8760 hours.  CBG:  Recent Labs Lab 08/19/15 1133 08/19/15 1638 08/19/15 2209 08/20/15 0753 08/20/15 1210  GLUCAP 139* 175* 164* 107* 124*       SignedDebbe Odea, MD [facility] 08/20/2015, 1:10 PM

## 2015-08-20 NOTE — Progress Notes (Signed)
Patient discharged to home with wife, IV dc'd.Tele.Dc'd,Vitals stable for patient. Discharge instructions and education reviewed and all questions addressed. 08/20/2015 6:41 PM Celisa Schoenberg

## 2015-08-20 NOTE — Progress Notes (Signed)
Pharmacist Provided - Patient Medication Education Prior to Discharge   Rodney [name] is an 49 y.o. male who presented to [facility] on 08/16/2015 with a chief complaint of  Chief Complaint  Patient presents with  . Abdominal Pain  . Back Pain     [x]  Patient will be discharged with 3 new medications   The following medications were discussed with the patient: hydralazine, isosorbide mononitrate, Prilosec  Pain Control medications: []  Yes    [x]  No  Diabetes Medications: []  Yes    [x]  No  Heart Failure Medications: []  Yes    [x]  No  Anticoagulation Medications:  []  Yes    [x]  No  Antibiotics at discharge: []  Yes    [x]  No  Allergy Assessment Completed and Updated: []  Yes    []  No Identified Patient Allergies:  Allergies  Allergen Reactions  . Simvastatin     REACTION: muscle aches     Medication Adherence Assessment: []  Excellent (no doses missed/week)      [x]  Good (1 dose missed/week)      []  Partial (2-3 doses missed/week)      []  Poor (>3 doses missed/week)  Barriers to Obtaining Medications: [x]  Yes []  No  NAYTHAN DOUTHIT expressed concern with obtaining medication, because of insurance issues.   Assessment: Mr. Albee had a good understanding of the changes in his medications and the signs of hypotension. He will be following up with his physician in 5 days and will notify them of any side effects, if any.  Time spent preparing for discharge counseling: 15 min Time spent counseling patient: 10 min  Joya San, PharmD Clinical Pharmacy Resident Pager # [phone] 08/20/2015 5:53 PM

## 2015-08-21 ENCOUNTER — Telehealth: Payer: Self-pay | Admitting: Behavioral Health

## 2015-08-21 NOTE — Telephone Encounter (Signed)
Unable to reach patient at time of TCM/Hospital Follow-up call. Left message for patient to return call when available.  

## 2015-08-25 ENCOUNTER — Ambulatory Visit: Payer: Self-pay | Admitting: Family Medicine

## 2015-08-27 ENCOUNTER — Emergency Department (HOSPITAL_COMMUNITY)
Admission: EM | Admit: 2015-08-27 | Discharge: 2015-08-27 | Disposition: A | Discharge status: Home / Self Care | Payer: Self-pay | Attending: Emergency Medicine | Admitting: Emergency Medicine

## 2015-08-27 ENCOUNTER — Encounter (HOSPITAL_COMMUNITY): Payer: Self-pay | Admitting: Emergency Medicine

## 2015-08-27 DIAGNOSIS — K2970 Gastritis, unspecified, without bleeding: Secondary | ICD-10-CM | POA: Insufficient documentation

## 2015-08-27 DIAGNOSIS — K2990 Gastroduodenitis, unspecified, without bleeding: Secondary | ICD-10-CM | POA: Insufficient documentation

## 2015-08-27 DIAGNOSIS — I10 Essential (primary) hypertension: Secondary | ICD-10-CM | POA: Insufficient documentation

## 2015-08-27 DIAGNOSIS — E1165 Type 2 diabetes mellitus with hyperglycemia: Secondary | ICD-10-CM | POA: Insufficient documentation

## 2015-08-27 DIAGNOSIS — Z79899 Other long term (current) drug therapy: Secondary | ICD-10-CM | POA: Insufficient documentation

## 2015-08-27 DIAGNOSIS — R739 Hyperglycemia, unspecified: Secondary | ICD-10-CM

## 2015-08-27 DIAGNOSIS — E785 Hyperlipidemia, unspecified: Secondary | ICD-10-CM | POA: Insufficient documentation

## 2015-08-27 LAB — COMPREHENSIVE METABOLIC PANEL
ALBUMIN: 3.1 g/dL — AB (ref 3.5–5.0)
ALT: 32 U/L (ref 17–63)
ANION GAP: 10 (ref 5–15)
AST: 23 U/L (ref 15–41)
Alkaline Phosphatase: 156 U/L — ABNORMAL HIGH (ref 38–126)
BILIRUBIN TOTAL: 0.8 mg/dL (ref 0.3–1.2)
BUN: 37 mg/dL — ABNORMAL HIGH (ref 6–20)
CHLORIDE: 102 mmol/L (ref 101–111)
CO2: 21 mmol/L — ABNORMAL LOW (ref 22–32)
Calcium: 8.5 mg/dL — ABNORMAL LOW (ref 8.9–10.3)
Creatinine, Ser: 4.11 mg/dL — ABNORMAL HIGH (ref 0.61–1.24)
GFR calc Af Amer: 18 mL/min — ABNORMAL LOW (ref >60)
GFR, EST NON AFRICAN AMERICAN: 15 mL/min — AB (ref >60)
GLUCOSE: 196 mg/dL — AB (ref 65–99)
POTASSIUM: 4.2 mmol/L (ref 3.5–5.1)
Sodium: 133 mmol/L — ABNORMAL LOW (ref 135–145)
TOTAL PROTEIN: 6.9 g/dL (ref 6.5–8.1)

## 2015-08-27 LAB — CBC WITH DIFFERENTIAL/PLATELET
BASOS ABS: 0.1 10*3/uL (ref 0.0–0.1)
BASOS PCT: 1 %
Eosinophils Absolute: 0.3 10*3/uL (ref 0.0–0.7)
Eosinophils Relative: 2 %
HEMATOCRIT: 29 % — AB (ref 39.0–52.0)
Hemoglobin: 9.4 g/dL — ABNORMAL LOW (ref 13.0–17.0)
LYMPHS PCT: 8 %
Lymphs Abs: 1 10*3/uL (ref 0.7–4.0)
MCH: 25.3 pg — ABNORMAL LOW (ref 26.0–34.0)
MCHC: 32.4 g/dL (ref 30.0–36.0)
MCV: 78 fL (ref 78.0–100.0)
MONO ABS: 1 10*3/uL (ref 0.1–1.0)
Monocytes Relative: 8 %
NEUTROS ABS: 10.1 10*3/uL — AB (ref 1.7–7.7)
NEUTROS PCT: 81 %
Platelets: 338 10*3/uL (ref 150–400)
RBC: 3.72 MIL/uL — AB (ref 4.22–5.81)
RDW: 16.3 % — AB (ref 11.5–15.5)
WBC: 12.4 10*3/uL — AB (ref 4.0–10.5)

## 2015-08-27 LAB — LIPASE, BLOOD: LIPASE: 85 U/L — AB (ref 22–51)

## 2015-08-27 MED ORDER — MORPHINE SULFATE (PF) 4 MG/ML IV SOLN
4.0000 mg | Freq: Once | INTRAVENOUS | Status: AC
  Administered 2015-08-27: 4 mg via INTRAVENOUS
  Filled 2015-08-27: qty 1

## 2015-08-27 MED ORDER — ONDANSETRON HCL 4 MG/2ML IJ SOLN
4.0000 mg | Freq: Once | INTRAMUSCULAR | Status: AC
  Administered 2015-08-27: 4 mg via INTRAVENOUS
  Filled 2015-08-27: qty 2

## 2015-08-27 MED ORDER — SODIUM CHLORIDE 0.9 % IV BOLUS (SEPSIS)
1000.0000 mL | Freq: Once | INTRAVENOUS | Status: AC
  Administered 2015-08-27: 1000 mL via INTRAVENOUS

## 2015-08-27 NOTE — ED Provider Notes (Signed)
CSN: [id number]     Arrival date & time 08/27/15  0531 History   First MD Initiated Contact with Patient 08/27/15 [phone]     Chief Complaint  Patient presents with  . Abdominal Pain     (Consider location/radiation/quality/duration/timing/severity/associated sxs/prior Treatment) HPI   Patient to the ER with epigastric abdominal pain. He was seen on 08/16/15 for similar pain and admitted with kidney injury. A CT abd/pelv, abd [place] and endoscopy was performed -- this showed him to have findings consistent with gastritis. Believed to be due to ETOH ingestion and medications. He says he has done well at home up until this morning. Last night he had steak marinated in a spicy marinade as well as Ginger Ale. He denies having spicy food or soda since his discharge. He reports the pain caused dry heaving but he did not produce any vomitus. The pain was so severe he came to the ED. The pain largely resolved on its own and then is completely gone after protocol Morphine given by RN. He reports having no symptoms now and feeling significantly better.  PCP: Garnet Koyanagi, DO  ROS: The patient denies diaphoresis, fever, headache, weakness (general or focal), confusion, change of vision,  dysphagia, aphagia, shortness of breath,  vomiting, diarrhea, lower extremity swelling, rash, neck pain, chest pain   Past Medical History  Diagnosis Date  . Gout   . Hyperlipidemia   . Hypertension   . Diabetes mellitus     Type 2   Past Surgical History  Procedure Laterality Date  . Esophagogastroduodenoscopy N/A 08/17/2015    Procedure: ESOPHAGOGASTRODUODENOSCOPY (EGD);  Surgeon: Wilford Corner, MD;  Location: [facility] ENDOSCOPY;  Service: Endoscopy;  Laterality: N/A;   Family History  Problem Relation Age of Onset  . Heart disease Mother 20    CHF, stent,   . Cancer Mother 45    breast  . Stroke Father   . Hypertension Father   . Hyperlipidemia Father   . Heart disease Maternal Aunt   . Heart disease Maternal  Uncle    Social History  Substance Use Topics  . Smoking status: Never Smoker   . Smokeless tobacco: Never Used  . Alcohol Use: 4.2 oz/week    7 Cans of beer per week    Review of Systems  10 Systems reviewed and are negative for acute change except as noted in the HPI.    Allergies  Simvastatin  Home Medications   Prior to Admission medications   Medication Sig Start Date End Date Taking? Authorizing Provider  atenolol (TENORMIN) 100 MG tablet Take 0.5 tablets (50 mg total) by mouth daily. 02/10/15  Yes Yvonne R Lowne, DO  cyanocobalamin (,VITAMIN B-12,) 1000 MCG/ML injection Inject 1 mL (1,000 mcg total) into the muscle every 30 (thirty) days. 02/10/15  Yes Alferd Apa Lowne, DO  Cyanocobalamin (VITAMIN B 12 PO) Take 1,000 mcg by mouth daily.   Yes Historical Provider, MD  ferrous sulfate 325 (65 FE) MG tablet Take 1 tablet (325 mg total) by mouth daily with breakfast. 08/20/15  Yes Debbe Odea, MD  hydrALAZINE (APRESOLINE) 50 MG tablet Take 1 tablet (50 mg total) by mouth 3 (three) times daily. 08/20/15  Yes Debbe Odea, MD  isosorbide mononitrate (IMDUR) 30 MG 24 hr tablet Take 1 tablet (30 mg total) by mouth daily. 08/20/15  Yes Debbe Odea, MD  Omega-3 Fatty Acids (FISH OIL) 1000 MG CAPS Take 2 capsules by mouth 2 (two) times daily.     Yes Historical Provider,  MD  omeprazole (PRILOSEC) 40 MG capsule Take 1 capsule (40 mg total) by mouth daily. 08/20/15  Yes Debbe Odea, MD  verapamil (CALAN-SR) 240 MG CR tablet Take 240 mg by mouth at bedtime.   Yes Historical Provider, MD  SYRINGE/NEEDLE, DISP, 1 ML (BD ECLIPSE SYRINGE) 25G X 5/8" 1 ML MISC 1 mL by Does not apply route every 30 (thirty) days. 06/21/12   Yvonne R Lowne, DO   BP 165/88 mmHg  Pulse 63  Temp(Src) 97.9 F (36.6 C) (Oral)  SpO2 98% Physical Exam  Constitutional: He appears well-developed and well-nourished. No distress.  HENT:  Head: Normocephalic and atraumatic.  Eyes: Pupils are equal, round, and reactive to  light.  Neck: Normal range of motion. Neck supple.  Cardiovascular: Normal rate and regular rhythm.   Pulmonary/Chest: Effort normal.  Abdominal: Soft. He exhibits no distension. There is no tenderness. There is no rigidity, no rebound, no guarding and no CVA tenderness.  No pain to palpation of abdomen  Musculoskeletal:  No LE swelling  Neurological: He is alert.  Skin: Skin is warm and dry.  Nursing note and vitals reviewed.   ED Course  Procedures (including critical care time) Labs Review Labs Reviewed  CBC WITH DIFFERENTIAL/PLATELET - Abnormal; Notable for the following:    WBC 12.4 (*)    RBC 3.72 (*)    Hemoglobin 9.4 (*)    HCT 29.0 (*)    MCH 25.3 (*)    RDW 16.3 (*)    Neutro Abs 10.1 (*)    All other components within normal limits  COMPREHENSIVE METABOLIC PANEL - Abnormal; Notable for the following:    Sodium 133 (*)    CO2 21 (*)    Glucose, Bld 196 (*)    BUN 37 (*)    Creatinine, Ser 4.11 (*)    Calcium 8.5 (*)    Albumin 3.1 (*)    Alkaline Phosphatase 156 (*)    GFR calc non Af Amer 15 (*)    GFR calc Af Amer 18 (*)    All other components within normal limits  LIPASE, BLOOD - Abnormal; Notable for the following:    Lipase 85 (*)    All other components within normal limits  URINALYSIS, ROUTINE W REFLEX MICROSCOPIC (NOT AT [facility])    Imaging Review No results found. I have personally reviewed and evaluated these images and lab results as part of my medical decision-making.   EKG Interpretation None      MDM   Final diagnoses:  Gastritis and gastroduodenitis  Hyperglycemia    [name] and I had a long discussion about his glucose level of 196, he was taken off of his metformin due to the effect it was having on his kidneys earlier this month. I will not start patient on insulin today but he has been instructed to frequently check his glucose and if it continues to rise he will need to see his PCP or return to the ED. His creatinine (4.11) and BUN  (37) are elevated from baseline creatinine (3.6) and BUN (35), this is likely due to dehydration and he has been given a liter of NS in the ED for therapy. He has not had any more episodes of pain while in the ED. We discussed appropriate diet, more fluid intake and foods to stay away from. He was discharged with medications for gastritis and plans to f/u with PCP. I do not feel that patient needs admission at this time.  Medications  sodium chloride 0.9 % bolus 1,000 mL (1,000 mLs Intravenous New Bag/Given 08/27/15 0725)  ondansetron (ZOFRAN) injection 4 mg (4 mg Intravenous Given 08/27/15 0617)  morphine 4 MG/ML injection 4 mg (4 mg Intravenous Given 08/27/15 0618)    49 y.o.Rodney [name] medical screening exam was performed and I feel the patient has had an appropriate workup for their chief complaint at this time and likelihood of emergent condition existing is low. They have been counseled on decision, discharge, follow up and which symptoms necessitate immediate return to the emergency department. They or their family verbally stated understanding and agreement with plan and discharged in stable condition.   Vital signs are stable at discharge. Filed Vitals:   08/27/15 0630  BP: 165/88  Pulse: 63  Temp:       Delos Haring, PA-C 08/27/15 [place], DO 08/27/15 [phone]

## 2015-08-27 NOTE — ED Notes (Signed)
Pt stated that he awoke this morning to diffuse abd pain that radiated to his back. Pt with N/V. Pt seen for the same last week.

## 2015-08-27 NOTE — Discharge Instructions (Signed)
Hyperglycemia °Hyperglycemia occurs when the glucose (sugar) in your blood is too high. Hyperglycemia can happen for many reasons, but it most often happens to people who do not know they have diabetes or are not managing their diabetes properly.  °CAUSES  °Whether you have diabetes or not, there are other causes of hyperglycemia. Hyperglycemia can occur when you have diabetes, but it can also occur in other situations that you might not be as aware of, such as: °Diabetes °· If you have diabetes and are having problems controlling your blood glucose, hyperglycemia could occur because of some of the following reasons: °· Not following your meal plan. °· Not taking your diabetes medications or not taking it properly. °· Exercising less or doing less activity than you normally do. °· Being sick. °Pre-diabetes °· This cannot be ignored. Before people develop Type 2 diabetes, they almost always have "pre-diabetes." This is when your blood glucose levels are higher than normal, but not yet high enough to be diagnosed as diabetes. Research has shown that some long-term damage to the body, especially the heart and circulatory system, may already be occurring during pre-diabetes. If you take action to manage your blood glucose when you have pre-diabetes, you may delay or prevent Type 2 diabetes from developing. °Stress °· If you have diabetes, you may be "diet" controlled or on oral medications or insulin to control your diabetes. However, you may find that your blood glucose is higher than usual in the hospital whether you have diabetes or not. This is often referred to as "stress hyperglycemia." Stress can elevate your blood glucose. This happens because of hormones put out by the body during times of stress. If stress has been the cause of your high blood glucose, it can be followed regularly by your caregiver. That way he/she can make sure your hyperglycemia does not continue to get worse or progress to  diabetes. °Steroids °· Steroids are medications that act on the infection fighting system (immune system) to block inflammation or infection. One side effect can be a rise in blood glucose. Most people can produce enough extra insulin to allow for this rise, but for those who cannot, steroids make blood glucose levels go even higher. It is not unusual for steroid treatments to "uncover" diabetes that is developing. It is not always possible to determine if the hyperglycemia will go away after the steroids are stopped. A special blood test called an A1c is sometimes done to determine if your blood glucose was elevated before the steroids were started. °SYMPTOMS °· Thirsty. °· Frequent urination. °· Dry mouth. °· Blurred vision. °· Tired or fatigue. °· Weakness. °· Sleepy. °· Tingling in feet or leg. °DIAGNOSIS  °Diagnosis is made by monitoring blood glucose in one or all of the following ways: °· A1c test. This is a chemical found in your blood. °· Fingerstick blood glucose monitoring. °· Laboratory results. °TREATMENT  °First, knowing the cause of the hyperglycemia is important before the hyperglycemia can be treated. Treatment may include, but is not be limited to: °· Education. °· Change or adjustment in medications. °· Change or adjustment in meal plan. °· Treatment for an illness, infection, etc. °· More frequent blood glucose monitoring. °· Change in exercise plan. °· Decreasing or stopping steroids. °· Lifestyle changes. °HOME CARE INSTRUCTIONS  °· Test your blood glucose as directed. °· Exercise regularly. Your caregiver will give you instructions about exercise. Pre-diabetes or diabetes which comes on with stress is helped by exercising. °· Eat wholesome,   balanced meals. Eat often and at regular, fixed times. Your caregiver or nutritionist will give you a meal plan to guide your sugar intake.  Being at an ideal weight is important. If needed, losing as little as 10 to 15 pounds may help improve blood  glucose levels. SEEK MEDICAL CARE IF:   You have questions about medicine, activity, or diet.  You continue to have symptoms (problems such as increased thirst, urination, or weight gain). SEEK IMMEDIATE MEDICAL CARE IF:   You are vomiting or have diarrhea.  Your breath smells fruity.  You are breathing faster or slower.  You are very sleepy or incoherent.  You have numbness, tingling, or pain in your feet or hands.  You have chest pain.  Your symptoms get worse even though you have been following your caregiver's orders.  If you have any other questions or concerns.   This information is not intended to replace advice given to you by your health care provider. Make sure you discuss any questions you have with your health care provider.   Document Released: 04/27/2001 Document Revised: 01/24/2012 Document Reviewed: 07/08/2015 Elsevier Interactive Patient Education 2016 [employer] Gastritis, Adult Gastritis is soreness and swelling (inflammation) of the lining of the stomach. Gastritis can develop as a sudden onset (acute) or long-term (chronic) condition. If gastritis is not treated, it can lead to stomach bleeding and ulcers. CAUSES  Gastritis occurs when the stomach lining is weak or damaged. Digestive juices from the stomach then inflame the weakened stomach lining. The stomach lining may be weak or damaged due to viral or bacterial infections. One common bacterial infection is the Helicobacter pylori infection. Gastritis can also result from excessive alcohol consumption, taking certain medicines, or having too much acid in the stomach.  SYMPTOMS  In some cases, there are no symptoms. When symptoms are present, they may include:  Pain or a burning sensation in the upper abdomen.  Nausea.  Vomiting.  An uncomfortable feeling of fullness after eating. DIAGNOSIS  Your caregiver may suspect you have gastritis based on your symptoms and a physical exam. To determine the  cause of your gastritis, your caregiver may perform the following:  Blood or stool tests to check for the H pylori bacterium.  Gastroscopy. A thin, flexible tube (endoscope) is passed down the esophagus and into the stomach. The endoscope has a light and camera on the end. Your caregiver uses the endoscope to view the inside of the stomach.  Taking a tissue sample (biopsy) from the stomach to examine under a microscope. TREATMENT  Depending on the cause of your gastritis, medicines may be prescribed. If you have a bacterial infection, such as an H pylori infection, antibiotics may be given. If your gastritis is caused by too much acid in the stomach, H2 blockers or antacids may be given. Your caregiver may recommend that you stop taking aspirin, ibuprofen, or other nonsteroidal anti-inflammatory drugs (NSAIDs). HOME CARE INSTRUCTIONS  Only take over-the-counter or prescription medicines as directed by your caregiver.  If you were given antibiotic medicines, take them as directed. Finish them even if you start to feel better.  Drink enough fluids to keep your urine clear or pale yellow.  Avoid foods and drinks that make your symptoms worse, such as:  Caffeine or alcoholic drinks.  Chocolate.  Peppermint or mint flavorings.  Garlic and onions.  Spicy foods.  Citrus fruits, such as oranges, lemons, or limes.  Tomato-based foods such as sauce, chili, salsa, and pizza.  Mace[place] Pro  and fatty foods.  Eat small, frequent meals instead of large meals. SEEK IMMEDIATE MEDICAL CARE IF:   You have black or dark red stools.  You vomit blood or material that looks like coffee grounds.  You are unable to keep fluids down.  Your abdominal pain gets worse.  You have a fever.  You do not feel better after 1 week.  You have any other questions or concerns. MAKE SURE YOU:  Understand these instructions.  Will watch your condition.  Will get help right away if you are not doing well or  get worse.   This information is not intended to replace advice given to you by your health care provider. Make sure you discuss any questions you have with your health care provider.   Document Released: 10/26/2001 Document Revised: 05/02/2012 Document Reviewed: 12/15/2011 Elsevier Interactive Patient Education [employer].

## 2015-09-18 ENCOUNTER — Telehealth: Payer: Self-pay | Admitting: Family Medicine

## 2015-09-18 NOTE — Telephone Encounter (Signed)
Pt needs ov. 

## 2015-09-18 NOTE — Telephone Encounter (Signed)
Relation to OM:AYOK Call back [phone] Pharmacy: [facility] [address] ([place]), [place] [phone] (Phone) [phone] (Fax)         Reason for call:  Requesting a refill isosorbide mononitrate (IMDUR) 30 MG 24 hr tablet patient will run out out medication.

## 2015-09-18 NOTE — Telephone Encounter (Signed)
Please advise, Rx prescribed at the ED.      KP

## 2015-09-19 NOTE — Telephone Encounter (Signed)
Patient will run out of blood pressure medication Sunday ask if there a way we could see him before Thursday or is it ok for him to be off this med until  Thursday patient is aware provider is out of office until Monday and will wait for her to respond.

## 2015-09-19 NOTE — Telephone Encounter (Signed)
Patient scheduled ED follow up for 09/25/15

## 2015-09-22 ENCOUNTER — Ambulatory Visit (INDEPENDENT_AMBULATORY_CARE_PROVIDER_SITE_OTHER): Payer: Self-pay | Admitting: Family Medicine

## 2015-09-22 ENCOUNTER — Encounter: Payer: Self-pay | Admitting: Family Medicine

## 2015-09-22 VITALS — BP 160/92 | HR 69 | Temp 98.8°F | Ht 67.0 in | Wt 226.4 lb

## 2015-09-22 DIAGNOSIS — R748 Abnormal levels of other serum enzymes: Secondary | ICD-10-CM

## 2015-09-22 DIAGNOSIS — Z0000 Encounter for general adult medical examination without abnormal findings: Secondary | ICD-10-CM

## 2015-09-22 DIAGNOSIS — E1122 Type 2 diabetes mellitus with diabetic chronic kidney disease: Secondary | ICD-10-CM

## 2015-09-22 DIAGNOSIS — I10 Essential (primary) hypertension: Secondary | ICD-10-CM

## 2015-09-22 DIAGNOSIS — N184 Chronic kidney disease, stage 4 (severe): Secondary | ICD-10-CM

## 2015-09-22 DIAGNOSIS — Z1159 Encounter for screening for other viral diseases: Secondary | ICD-10-CM

## 2015-09-22 DIAGNOSIS — E1165 Type 2 diabetes mellitus with hyperglycemia: Secondary | ICD-10-CM

## 2015-09-22 LAB — COMPREHENSIVE METABOLIC PANEL
ALK PHOS: 117 U/L (ref 39–117)
ALT: 11 U/L (ref 0–53)
AST: 12 U/L (ref 0–37)
Albumin: 3.6 g/dL (ref 3.5–5.2)
BILIRUBIN TOTAL: 0.5 mg/dL (ref 0.2–1.2)
BUN: 30 mg/dL — AB (ref 6–23)
CO2: 22 meq/L (ref 19–32)
Calcium: 9.2 mg/dL (ref 8.4–10.5)
Chloride: 104 mEq/L (ref 96–112)
Creatinine, Ser: 3.12 mg/dL — ABNORMAL HIGH (ref 0.40–1.50)
GFR: 22.49 mL/min — ABNORMAL LOW (ref >60.00)
GLUCOSE: 141 mg/dL — AB (ref 70–99)
Potassium: 4 mEq/L (ref 3.5–5.1)
SODIUM: 136 meq/L (ref 135–145)
TOTAL PROTEIN: 7.1 g/dL (ref 6.0–8.3)

## 2015-09-22 LAB — LIPID PANEL
CHOL/HDL RATIO: 6
Cholesterol: 169 mg/dL (ref 0–200)
HDL: 26.9 mg/dL — ABNORMAL LOW (ref >39.00)
LDL Cholesterol: 103 mg/dL — ABNORMAL HIGH (ref 0–99)
NONHDL: 142.06
Triglycerides: 193 mg/dL — ABNORMAL HIGH (ref 0.0–149.0)
VLDL: 38.6 mg/dL (ref 0.0–40.0)

## 2015-09-22 MED ORDER — ISOSORBIDE MONONITRATE ER 30 MG PO TB24: 30.0000 mg | ORAL_TABLET | Freq: Every day | ORAL | Status: DC

## 2015-09-22 NOTE — Patient Instructions (Signed)
Chronic Kidney Disease °Chronic kidney disease occurs when the kidneys are damaged over a long period. The kidneys are two organs that lie on either side of the spine between the middle of the back and the front of the abdomen. The kidneys: °· Remove wastes and extra water from the blood. °· Produce important hormones. These help keep bones strong, regulate blood pressure, and help create red blood cells. °· Balance the fluids and chemicals in the blood and tissues. °A small amount of kidney damage may not cause problems, but a large amount of damage may make it difficult or impossible for the kidneys to work the way they should. If steps are not taken to slow down the kidney damage or stop it from getting worse, the kidneys may stop working permanently. Most of the time, chronic kidney disease does not go away. However, it can often be controlled, and those with the disease can usually live normal lives. °CAUSES °The most common causes of chronic kidney disease are diabetes and high blood pressure (hypertension). Chronic kidney disease may also be caused by: °· Diseases that cause the kidneys' filters to become inflamed. °· Diseases that affect the immune system. °· Genetic diseases. °· Medicines that damage the kidneys, such as anti-inflammatory medicines. °· Poisoning or exposure to toxic substances. °· A reoccurring kidney or urinary infection. °· A problem with urine flow. This may be caused by: °· Cancer. °· Kidney stones. °· An enlarged prostate in males. °SIGNS AND SYMPTOMS °Because the kidney damage in chronic kidney disease occurs slowly, symptoms develop slowly and may not be obvious until the kidney damage becomes severe. A person may have a kidney disease for years without showing any symptoms. Symptoms can include: °· Swelling (edema) of the legs, ankles, or feet. °· Tiredness (lethargy). °· Nausea or vomiting. °· Confusion. °· Problems with urination, such as: °· Decreased urine  production. °· Frequent urination, especially at night. °· Frequent accidents in children who are potty trained. °· Muscle twitches and cramps. °· Shortness of breath. °· Weakness. °· Persistent itchiness. °· Loss of appetite. °· Metallic taste in the mouth. °· Trouble sleeping. °· Slowed development in children. °· Short stature in children. °DIAGNOSIS °Chronic kidney disease may be detected and diagnosed by tests, including blood, urine, imaging, or kidney biopsy tests. °TREATMENT °Most chronic kidney diseases cannot be cured. Treatment usually involves relieving symptoms and preventing or slowing the progression of the disease. Treatment may include: °· A special diet. You may need to avoid alcohol and foods that are salty and high in potassium. °· Medicines. These may: °· Lower blood pressure. °· Relieve anemia. °· Relieve swelling. °· Protect the bones. °HOME CARE INSTRUCTIONS °· Follow your prescribed diet.  Your health care provider may instruct you to limit daily salt (sodium) and protein intake. °· Take medicines only as directed by your health care provider. Do not take any new medicines (prescription, over-the-counter, or nutritional supplements) unless approved by your health care provider. Many medicines can worsen your kidney damage or need to have the dose adjusted.   °· Quit smoking if you smoke. Talk to your health care provider about a smoking cessation program. °· Keep all follow-up visits as directed by your health care provider. °· Monitor your blood pressure. °· Start or continue an exercise plan. °· Get immunizations as directed by your health care provider. °· Take vitamin and mineral supplements as directed by your health care provider. °SEEK IMMEDIATE MEDICAL CARE IF: °· Your symptoms get worse or you develop   new symptoms. °· You develop symptoms of end-stage kidney disease. These include: °¨ Headaches. °¨ Abnormally dark or light skin. °¨ Numbness in the hands or feet. °¨ Easy  bruising. °¨ Frequent hiccups. °¨ Menstruation stops. °· You have a fever. °· You have decreased urine production. °· You have pain or bleeding when urinating. °MAKE SURE YOU: °· Understand these instructions. °· Will watch your condition. °· Will get help right away if you are not doing well or get worse. °FOR MORE INFORMATION  °· American Association of Kidney Patients: www.aakp.org °· National Kidney Foundation: www.kidney.org °· [employer]: www.akfinc.org °· [employer] [facility]: www.lifeoptions.org and www.kidneyschool.org °  °This information is not intended to replace advice given to you by your health care provider. Make sure you discuss any questions you have with your health care provider. °  °Document Released: 08/10/2008 Document Revised: 11/22/2014 Document Reviewed: 06/30/2012 °Elsevier Interactive Patient Education ©2016 [employer] ° °Food Basics for Chronic Kidney Disease °When your kidneys are not working well, they cannot remove waste and excess substances from your blood as effectively as they did before. This can lead to a buildup and imbalance of these substances, which can affect how your body functions. This buildup can also make your kidneys work harder, causing even more damage. °You may need to eat less of certain foods that can lead to the buildup of these substances in your body. By making the changes to your diet that are recommended by your dietitian or health care provider, you could possibly help prevent further kidney damage and delay or prevent the need for dialysis. °The following information can help give you a basic understanding of these substances and how they affect your bodily functions. The information also gives examples of foods that contain the highest amounts of these substances. °WHAT DO I NEED TO KNOW ABOUT SUBSTANCES IN MY FOOD THAT I MAY NEED TO ADJUST? °Food adjustments will be different for each person with chronic kidney disease. It is  important that you see a dietitian who can help you determine the specific adjustments that you will need to make for each of the following substances: °Potassium °Potassium affects how steadily your heart beats. If too much potassium builds up in your blood, it can cause an irregular heartbeat or even a heart attack. °Examples of foods rich in potassium include: °· Milk. °· Fruits. °· Vegetables. °Phosphorus °Phosphorus is a mineral found in your bones. A balance between calcium and phosphorous is needed to build and maintain healthy bones. Too much phosphorus pulls calcium from your bones. This can make your bones weak and more likely to break. Too much phosphorus can also make your skin itch. °Examples of foods rich in phosphorus include: °· Milk and cheese. °· Dried beans. °· Peas. °· Colas. °· Nuts and peanut butter. °Animal Protein °Animal protein helps you make and keep muscle. It also helps in the repair of your body's cells and tissues. One of the natural breakdown products of protein is a waste product called urea. When your kidneys are not working properly, they cannot remove wastes such as urea like they did before you developed chronic kidney disease. You will likely need to limit the amount of protein you eat to help prevent a buildup of urea in your blood. Examples of animal protein include: °· Meat (all types). °· Fish and seafood. °· Poultry. °· Eggs. °Sodium °Sodium, which is found in salt, helps maintain a healthy balance of fluids in your body. Too much   sodium can increase your blood pressure level and have a negative affect on the function of your heart and lungs. Too much sodium also can cause your body to retain too much fluid, making your kidneys work harder. Examples of foods with high levels of sodium include: °· Salt seasonings. °· Soy sauce. °· Cured and processed meats. °· Salted crackers and snack foods. °· Fast food. °· Canned soups and most canned foods. °Glucose °Glucose provides  energy for your body. If you have diabetes mellitus that is not properly controlled, you have too much glucose in your blood. Too much glucose in your blood can worsen the function of your kidneys by damaging small blood vessels. This prevents enough blood flow to your kidneys to give them what they need to work. If you have diabetes mellitus and chronic kidney disease, it is important to maintain your blood glucose at a level recommended by your health care provider. °SHOULD I TAKE A VITAMIN AND MINERAL SUPPLEMENT? °Because you may need to avoid eating certain foods, you may not get all of the vitamins and minerals that would normally come from those foods. Your health care provider or dietitian may recommend that you take a supplement to ensure that you get all of the vitamins and minerals that your body needs.  °  °This information is not intended to replace advice given to you by your health care provider. Make sure you discuss any questions you have with your health care provider. °  °Document Released: 01/22/2003 Document Revised: 11/22/2014 Document Reviewed: 09/28/2013 °Elsevier Interactive Patient Education ©2016 [employer] Inc. ° °

## 2015-09-22 NOTE — Progress Notes (Signed)
Patient ID: Rodney [name], male    DOB: 1966/10/12  Age: 49 y.o. MRN: [id number]    Subjective:  Subjective HPI Rodney [name] presents for f/u hospital for ARF , dm , htn. Pt was not checking glucose and has not been compliant with care.  He has been referred to nephrology several times.   Pt states he is ready to go now.    Review of Systems  Constitutional: Negative for diaphoresis, appetite change, fatigue and unexpected weight change.  Eyes: Negative for pain, redness and visual disturbance.  Respiratory: Negative for cough, chest tightness, shortness of breath and wheezing.   Cardiovascular: Negative for chest pain, palpitations and leg swelling.  Endocrine: Negative for cold intolerance, heat intolerance, polydipsia, polyphagia and polyuria.  Genitourinary: Negative for dysuria, frequency and difficulty urinating.  Neurological: Negative for dizziness, light-headedness, numbness and headaches.    History Past Medical History  Diagnosis Date  . Gout   . Hyperlipidemia   . Hypertension   . Diabetes mellitus     Type 2    He has past surgical history that includes Esophagogastroduodenoscopy (N/A, 08/17/2015).   His family history includes Cancer (age of onset: 32) in his mother; Heart disease in his maternal aunt and maternal uncle; Heart disease (age of onset: 5) in his mother; Hyperlipidemia in his father; Hypertension in his father; Stroke in his father.He reports that he has never smoked. He has never used smokeless tobacco. He reports that he drinks about 4.2 oz of alcohol per week. He reports that he does not use illicit drugs.  Current Outpatient Prescriptions on File Prior to Visit  Medication Sig Dispense Refill  . atenolol (TENORMIN) 100 MG tablet Take 0.5 tablets (50 mg total) by mouth daily. 90 tablet 1  . cyanocobalamin (,VITAMIN B-12,) 1000 MCG/ML injection Inject 1 mL (1,000 mcg total) into the muscle every 30 (thirty) days. 3 mL 1  . ferrous sulfate 325 (65  FE) MG tablet Take 1 tablet (325 mg total) by mouth daily with breakfast. 30 tablet 3  . hydrALAZINE (APRESOLINE) 50 MG tablet Take 1 tablet (50 mg total) by mouth 3 (three) times daily. 90 tablet 0  . Omega-3 Fatty Acids (FISH OIL) 1000 MG CAPS Take 2 capsules by mouth 2 (two) times daily.      Marland Kitchen omeprazole (PRILOSEC) 40 MG capsule Take 1 capsule (40 mg total) by mouth daily. 30 capsule 0  . SYRINGE/NEEDLE, DISP, 1 ML (BD ECLIPSE SYRINGE) 25G X 5/8" 1 ML MISC 1 mL by Does not apply route every 30 (thirty) days. 50 each 0  . verapamil (CALAN-SR) 240 MG CR tablet Take 240 mg by mouth at bedtime.     No current facility-administered medications on file prior to visit.     Objective:  Objective Physical Exam  Constitutional: He is oriented to person, place, and time. Vital signs are normal. He appears well-developed and well-nourished. He is sleeping.  HENT:  Head: Normocephalic and atraumatic.  Mouth/Throat: Oropharynx is clear and moist.  Eyes: EOM are normal. Pupils are equal, round, and reactive to light.  Neck: Normal range of motion. Neck supple. No thyromegaly present.  Cardiovascular: Normal rate and regular rhythm.   No murmur heard. Pulmonary/Chest: Effort normal and breath sounds normal. No respiratory distress. He has no wheezes. He has no rales. He exhibits no tenderness.  Musculoskeletal: He exhibits no edema or tenderness.  Neurological: He is alert and oriented to person, place, and time.  Skin: Skin is warm  and dry.  Psychiatric: He has a normal mood and affect. His behavior is normal. Judgment and thought content normal.  Nursing note and vitals reviewed. Sensory exam of the foot is normal, tested with the monofilament. Good pulses, no lesions or ulcers, good peripheral pulses.   BP 160/92 mmHg  Pulse 69  Temp(Src) 98.8 F (37.1 C) (Oral)  Ht '5\' 7"'  (1.702 m)  Wt 226 lb 6.4 oz (102.694 kg)  BMI 35.45 kg/m2  SpO2 98% Wt Readings from Last 3 Encounters:  09/22/15 226  lb 6.4 oz (102.694 kg)  08/16/15 231 lb 7.7 oz (105 kg)  03/25/15 226 lb (102.513 kg)     Lab Results  Component Value Date   WBC 12.4* 08/27/2015   HGB 9.4* 08/27/2015   HCT 29.0* 08/27/2015   PLT 338 08/27/2015   GLUCOSE 141* 09/22/2015   CHOL 169 09/22/2015   TRIG 193.0* 09/22/2015   HDL 26.90* 09/22/2015   LDLDIRECT 96.0 03/25/2015   LDLCALC 103* 09/22/2015   ALT 11 09/22/2015   AST 12 09/22/2015   NA 136 09/22/2015   K 4.0 09/22/2015   CL 104 09/22/2015   CREATININE 3.12* 09/22/2015   BUN 30* 09/22/2015   CO2 22 09/22/2015   TSH 3.96 08/01/2012   PSA 0.41 08/01/2012   INR 1.06 08/16/2015   HGBA1C 7.6* 08/16/2015   MICROALBUR 90.9* 03/25/2015    No results found.   Assessment & Plan:  Plan I have discontinued Mr. Kofoed's Cyanocobalamin (VITAMIN B 12 PO). I am also having him maintain his Fish Oil, SYRINGE/NEEDLE (DISP) 1 ML, cyanocobalamin, atenolol, verapamil, ferrous sulfate, hydrALAZINE, omeprazole, and isosorbide mononitrate.  Meds ordered this encounter  Medications  . isosorbide mononitrate (IMDUR) 30 MG 24 hr tablet    Sig: Take 1 tablet (30 mg total) by mouth daily.    Dispense:  90 tablet    Refill:  1    Problem List Items Addressed This Visit    Preventative health care - Primary   Relevant Orders   Ambulatory referral to Gastroenterology   Essential hypertension   Relevant Medications   isosorbide mononitrate (IMDUR) 30 MG 24 hr tablet    Other Visit Diagnoses    Chronic renal disease, stage 4, severely decreased glomerular filtration rate between 15-29 mL/min/1.73 square meter (HCC)        Relevant Orders    Ambulatory referral to Nephrology    Comp Met (CMET) (Completed)    Elevated liver enzymes        Relevant Orders    Comp Met (CMET) (Completed)    Uncontrolled type 2 diabetes mellitus with chronic kidney disease, without long-term current use of insulin, unspecified CKD stage ([place])        Relevant Orders    Lipid panel  (Completed)    Ambulatory referral to Ophthalmology    Need for hepatitis C screening test        Relevant Orders    Hepatitis C antibody       Follow-up: Return in about 1 month (around 10/22/2015), or if symptoms worsen or fail to improve, for bp check.  Garnet Koyanagi, DO

## 2015-09-22 NOTE — Progress Notes (Signed)
Pre visit review using our clinic review tool, if applicable. No additional management support is needed unless otherwise documented below in the visit note. 

## 2015-09-23 ENCOUNTER — Telehealth: Payer: Self-pay | Admitting: Family Medicine

## 2015-09-23 ENCOUNTER — Encounter: Payer: Self-pay | Admitting: Family Medicine

## 2015-09-23 LAB — HEPATITIS C ANTIBODY: HCV Ab: NEGATIVE

## 2015-09-23 NOTE — Telephone Encounter (Signed)
Opened in error

## 2015-09-25 ENCOUNTER — Ambulatory Visit: Payer: Self-pay | Admitting: Family Medicine

## 2016-01-01 ENCOUNTER — Other Ambulatory Visit: Payer: Self-pay | Admitting: Family Medicine

## 2017-03-31 ENCOUNTER — Ambulatory Visit (INDEPENDENT_AMBULATORY_CARE_PROVIDER_SITE_OTHER): Payer: BLUE CROSS/BLUE SHIELD | Admitting: Family Medicine

## 2017-03-31 ENCOUNTER — Encounter: Payer: Self-pay | Admitting: Family Medicine

## 2017-03-31 VITALS — BP 140/84 | HR 66 | Temp 98.4°F | Ht 67.0 in | Wt 226.4 lb

## 2017-03-31 DIAGNOSIS — E1122 Type 2 diabetes mellitus with diabetic chronic kidney disease: Secondary | ICD-10-CM | POA: Diagnosis not present

## 2017-03-31 DIAGNOSIS — I10 Essential (primary) hypertension: Secondary | ICD-10-CM | POA: Diagnosis not present

## 2017-03-31 DIAGNOSIS — N184 Chronic kidney disease, stage 4 (severe): Secondary | ICD-10-CM

## 2017-03-31 DIAGNOSIS — E785 Hyperlipidemia, unspecified: Secondary | ICD-10-CM

## 2017-03-31 DIAGNOSIS — M109 Gout, unspecified: Secondary | ICD-10-CM

## 2017-03-31 DIAGNOSIS — E538 Deficiency of other specified B group vitamins: Secondary | ICD-10-CM

## 2017-03-31 DIAGNOSIS — Z0000 Encounter for general adult medical examination without abnormal findings: Secondary | ICD-10-CM

## 2017-03-31 DIAGNOSIS — Z23 Encounter for immunization: Secondary | ICD-10-CM

## 2017-03-31 MED ORDER — CYANOCOBALAMIN 1000 MCG/ML IJ SOLN: INTRAMUSCULAR | 4 refills | Status: DC

## 2017-03-31 MED ORDER — HEPATITIS B VAC RECOMBINANT 10 MCG/0.5ML IJ SUSP
0.5000 mL | Freq: Once | INTRAMUSCULAR | Status: AC
  Administered 2017-03-31: 0.5 mL via INTRAMUSCULAR

## 2017-03-31 NOTE — Assessment & Plan Note (Signed)
Check labs Pt on transplant list

## 2017-03-31 NOTE — Assessment & Plan Note (Signed)
Well controlled, no changes to meds. Encouraged heart healthy diet such as the DASH diet and exercise as tolerated.  °

## 2017-03-31 NOTE — Progress Notes (Signed)
Patient ID: Rodney [name], male    DOB: [birth year]  Age: 51 y.o. MRN: [id number]    Subjective:  Subjective  HPI Rodney [name] presents for f/u dm, chol and bp.  He is hoping to be put on the kidney transplant list.    Review of Systems  Constitutional: Negative for appetite change, diaphoresis, fatigue and unexpected weight change.  Eyes: Negative for pain, redness and visual disturbance.  Respiratory: Negative for cough, chest tightness, shortness of breath and wheezing.   Cardiovascular: Negative for chest pain, palpitations and leg swelling.  Endocrine: Negative for cold intolerance, heat intolerance, polydipsia, polyphagia and polyuria.  Genitourinary: Negative for difficulty urinating, dysuria and frequency.  Neurological: Negative for dizziness, light-headedness, numbness and headaches.    History Past Medical History:  Diagnosis Date  . Diabetes mellitus    Type 2  . Gout   . Hyperlipidemia   . Hypertension     He has a past surgical history that includes Esophagogastroduodenoscopy (N/A, 08/17/2015).   His family history includes Cancer (age of onset: 79) in his mother; Heart disease in his maternal aunt and maternal uncle; Heart disease (age of onset: 9) in his mother; Hyperlipidemia in his father; Hypertension in his father; Stroke in his father.He reports that he has never smoked. He has never used smokeless tobacco. He reports that he drinks about 4.2 oz of alcohol per week . He reports that he does not use drugs.  Current Outpatient Prescriptions on File Prior to Visit  Medication Sig Dispense Refill  . ferrous sulfate 325 (65 FE) MG tablet Take 1 tablet (325 mg total) by mouth daily with breakfast. 30 tablet 3  . hydrALAZINE (APRESOLINE) 50 MG tablet Take 1 tablet (50 mg total) by mouth 3 (three) times daily. 90 tablet 0  . Omega-3 Fatty Acids (FISH OIL) 1000 MG CAPS Take 2 capsules by mouth 2 (two) times daily.      Marland Kitchen omeprazole (PRILOSEC) 40 MG capsule Take 1  capsule (40 mg total) by mouth daily. 30 capsule 0  . SYRINGE/NEEDLE, DISP, 1 ML (BD ECLIPSE SYRINGE) 25G X 5/8" 1 ML MISC 1 mL by Does not apply route every 30 (thirty) days. 50 each 0   No current facility-administered medications on file prior to visit.      Objective:  Objective  Physical Exam  Constitutional: He is oriented to person, place, and time. Vital signs are normal. He appears well-developed and well-nourished. He is sleeping.  HENT:  Head: Normocephalic and atraumatic.  Mouth/Throat: Oropharynx is clear and moist.  Eyes: EOM are normal. Pupils are equal, round, and reactive to light.  Neck: Normal range of motion. Neck supple. No thyromegaly present.  Cardiovascular: Normal rate and regular rhythm.   No murmur heard. Pulmonary/Chest: Effort normal and breath sounds normal. No respiratory distress. He has no wheezes. He has no rales. He exhibits no tenderness.  Musculoskeletal: He exhibits no edema or tenderness.  Neurological: He is alert and oriented to person, place, and time.  Skin: Skin is warm and dry.  Psychiatric: He has a normal mood and affect. His behavior is normal. Judgment and thought content normal.  Nursing note and vitals reviewed. Sensory exam of the foot is normal, tested with the monofilament. Good pulses, no lesions or ulcers, good peripheral pulses. BP 140/84 (BP Location: Left Arm, Patient Position: Sitting, Cuff Size: Normal)   Pulse 66   Temp 98.4 F (36.9 C) (Oral)   Ht 5\' 7"  (1.702 m)  Wt 226 lb 6 oz (102.7 kg)   SpO2 98%   BMI 35.46 kg/m  Wt Readings from Last 3 Encounters:  03/31/17 226 lb 6 oz (102.7 kg)  09/22/15 226 lb 6.4 oz (102.7 kg)  08/16/15 231 lb 7.7 oz (105 kg)     Lab Results  Component Value Date   WBC 12.4 (H) 08/27/2015   HGB 9.4 (L) 08/27/2015   HCT 29.0 (L) 08/27/2015   PLT 338 08/27/2015   GLUCOSE 141 (H) 09/22/2015   CHOL 169 09/22/2015   TRIG 193.0 (H) 09/22/2015   HDL 26.90 (L) 09/22/2015   LDLDIRECT  96.0 03/25/2015   LDLCALC 103 (H) 09/22/2015   ALT 11 09/22/2015   AST 12 09/22/2015   NA 136 09/22/2015   K 4.0 09/22/2015   CL 104 09/22/2015   CREATININE 3.12 (H) 09/22/2015   BUN 30 (H) 09/22/2015   CO2 22 09/22/2015   TSH 3.96 08/01/2012   PSA 0.41 08/01/2012   INR 1.06 08/16/2015   HGBA1C 7.6 (H) 08/16/2015   MICROALBUR 90.9 (H) 03/25/2015    No results found.   Assessment & Plan:  Plan  I have discontinued Mr. Greenwalt's atenolol, verapamil, and isosorbide mononitrate. I am also having him maintain his Fish Oil, SYRINGE/NEEDLE (DISP) 1 ML, ferrous sulfate, hydrALAZINE, omeprazole, spironolactone, carvedilol, allopurinol, and cyanocobalamin. We administered hepatitis b vaccine for neonates.  Meds ordered this encounter  Medications  . spironolactone (ALDACTONE) 25 MG tablet    Sig: Take 25 mg by mouth daily.  . carvedilol (COREG) 25 MG tablet    Sig: Take 25 mg by mouth 2 (two) times daily with a meal.  . allopurinol (ZYLOPRIM) 100 MG tablet    Sig: Take 100 mg by mouth 2 (two) times daily.  . cyanocobalamin (,VITAMIN B-12,) 1000 MCG/ML injection    Sig: INJECT 1 ML INTO THE MUSCLE EVERY 30 DAYS.    Dispense:  3 mL    Refill:  4  . hepatitis b vaccine for neonates (ENGERIX-B) injection 0.5 mL    Problem List Items Addressed This Visit      Unprioritized   Preventative health care - Primary   Relevant Orders   Ambulatory referral to Gastroenterology   B12 deficiency   Relevant Medications   cyanocobalamin (,VITAMIN B-12,) 1000 MCG/ML injection   Other Relevant Orders   Vitamin B12   GOUT   Relevant Orders   Uric acid   Essential hypertension    Well controlled, no changes to meds. Encouraged heart healthy diet such as the DASH diet and exercise as tolerated.       Relevant Medications   spironolactone (ALDACTONE) 25 MG tablet   carvedilol (COREG) 25 MG tablet   Other Relevant Orders   Lipid panel   Hemoglobin A1c   POCT urinalysis dipstick    Microalbumin / creatinine urine ratio   Comprehensive metabolic panel   Hyperlipidemia LDL goal <70    Encouraged heart healthy diet, increase exercise, avoid trans fats, consider a krill oil cap daily      Relevant Medications   spironolactone (ALDACTONE) 25 MG tablet   carvedilol (COREG) 25 MG tablet   Stage 4 chronic kidney disease ([place])    Per nephrology      Relevant Orders   Lipid panel   Hemoglobin A1c   POCT urinalysis dipstick   Microalbumin / creatinine urine ratio   Comprehensive metabolic panel   Type 2 diabetes mellitus with stage 4 chronic kidney disease, without long-term  current use of insulin (HCC)    Check labs Pt on transplant list      Relevant Orders   Lipid panel   Hemoglobin A1c   POCT urinalysis dipstick   Microalbumin / creatinine urine ratio   Comprehensive metabolic panel    Other Visit Diagnoses    Need for 23-polyvalent pneumococcal polysaccharide vaccine       Relevant Orders   Pneumococcal polysaccharide vaccine 23-valent greater than or equal to 2yo subcutaneous/IM (Completed)   Need for zoster vaccine       Relevant Orders   Varicella-zoster vaccine IM (Completed)   Need for hepatitis B vaccination       Relevant Medications   hepatitis b vaccine for neonates (ENGERIX-B) injection 0.5 mL (Completed)      Follow-up: Return in about 3 months (around 07/01/2017) for hypertension, hyperlipidemia, diabetes II, annual exam, fasting.  Ann Held, DO

## 2017-03-31 NOTE — Patient Instructions (Signed)
Carbohydrate Counting for Diabetes Mellitus, Adult Carbohydrate counting is a method for keeping track of how many carbohydrates you eat. Eating carbohydrates naturally increases the amount of sugar (glucose) in the blood. Counting how many carbohydrates you eat helps keep your blood glucose within normal limits, which helps you manage your diabetes (diabetes mellitus). It is important to know how many carbohydrates you can safely have in each meal. This is different for every person. A diet and nutrition specialist (registered dietitian) can help you make a meal plan and calculate how many carbohydrates you should have at each meal and snack. Carbohydrates are found in the following foods:  Grains, such as breads and cereals.  Dried beans and soy products.  Starchy vegetables, such as potatoes, peas, and corn.  Fruit and fruit juices.  Milk and yogurt.  Sweets and snack foods, such as cake, cookies, candy, chips, and soft drinks. How do I count carbohydrates? There are two ways to count carbohydrates in food. You can use either of the methods or a combination of both. Reading "Nutrition Facts" on packaged food  The "Nutrition Facts" list is included on the labels of almost all packaged foods and beverages in the [place] It includes:  The serving size.  Information about nutrients in each serving, including the grams (g) of carbohydrate per serving. To use the "Nutrition Facts":  Decide how many servings you will have.  Multiply the number of servings by the number of carbohydrates per serving.  The resulting number is the total amount of carbohydrates that you will be having. Learning standard serving sizes of other foods  When you eat foods containing carbohydrates that are not packaged or do not include "Nutrition Facts" on the label, you need to measure the servings in order to count the amount of carbohydrates:  Measure the foods that you will eat with a food scale or measuring  cup, if needed.  Decide how many standard-size servings you will eat.  Multiply the number of servings by 15. Most carbohydrate-rich foods have about 15 g of carbohydrates per serving.  For example, if you eat 8 oz (170 g) of strawberries, you will have eaten 2 servings and 30 g of carbohydrates (2 servings x 15 g = 30 g).  For foods that have more than one food mixed, such as soups and casseroles, you must count the carbohydrates in each food that is included. The following list contains standard serving sizes of common carbohydrate-rich foods. Each of these servings has about 15 g of carbohydrates:   hamburger bun or  English muffin.   oz (15 mL) syrup.   oz (14 g) jelly.  1 slice of bread.  1 six-inch tortilla.  3 oz (85 g) cooked rice or pasta.  4 oz (113 g) cooked dried beans.  4 oz (113 g) starchy vegetable, such as peas, corn, or potatoes.  4 oz (113 g) hot cereal.  4 oz (113 g) mashed potatoes or  of a large baked potato.  4 oz (113 g) canned or frozen fruit.  4 oz (120 mL) fruit juice.  4-6 crackers.  6 chicken nuggets.  6 oz (170 g) unsweetened dry cereal.  6 oz (170 g) plain fat-free yogurt or yogurt sweetened with artificial sweeteners.  8 oz (240 mL) milk.  8 oz (170 g) fresh fruit or one small piece of fruit.  24 oz (680 g) popped popcorn. Example of carbohydrate counting Sample meal  3 oz (85 g) chicken breast.  6 oz (  170 g) brown rice.  4 oz (113 g) corn.  8 oz (240 mL) milk.  8 oz (170 g) strawberries with sugar-free whipped topping. Carbohydrate calculation 1. Identify the foods that contain carbohydrates:  Rice.  Corn.  Milk.  Strawberries. 2. Calculate how many servings you have of each food:  2 servings rice.  1 serving corn.  1 serving milk.  1 serving strawberries. 3. Multiply each number of servings by 15 g:  2 servings rice x 15 g = 30 g.  1 serving corn x 15 g = 15 g.  1 serving milk x 15 g = 15  g.  1 serving strawberries x 15 g = 15 g. 4. Add together all of the amounts to find the total grams of carbohydrates eaten:  30 g + 15 g + 15 g + 15 g = 75 g of carbohydrates total. This information is not intended to replace advice given to you by your health care provider. Make sure you discuss any questions you have with your health care provider. Document Released: 11/01/2005 Document Revised: 05/21/2016 Document Reviewed: 04/14/2016 Elsevier Interactive Patient Education  2017 [employer]  

## 2017-03-31 NOTE — Assessment & Plan Note (Signed)
Encouraged heart healthy diet, increase exercise, avoid trans fats, consider a krill oil cap daily 

## 2017-03-31 NOTE — Assessment & Plan Note (Signed)
Per nephrology 

## 2017-04-01 ENCOUNTER — Encounter: Payer: Self-pay | Admitting: Internal Medicine

## 2017-04-01 LAB — LIPID PANEL
CHOL/HDL RATIO: 4
Cholesterol: 124 mg/dL (ref 0–200)
HDL: 28.5 mg/dL — ABNORMAL LOW (ref >39.00)
LDL Cholesterol: 69 mg/dL (ref 0–99)
NONHDL: 95.43
Triglycerides: 134 mg/dL (ref 0.0–149.0)
VLDL: 26.8 mg/dL (ref 0.0–40.0)

## 2017-04-01 LAB — COMPREHENSIVE METABOLIC PANEL
ALT: 13 U/L (ref 0–53)
AST: 20 U/L (ref 0–37)
Albumin: 4.5 g/dL (ref 3.5–5.2)
Alkaline Phosphatase: 104 U/L (ref 39–117)
BUN: 51 mg/dL — ABNORMAL HIGH (ref 6–23)
CALCIUM: 9.3 mg/dL (ref 8.4–10.5)
CO2: 22 meq/L (ref 19–32)
Chloride: 105 mEq/L (ref 96–112)
Creatinine, Ser: 3.52 mg/dL — ABNORMAL HIGH (ref 0.40–1.50)
GFR: 19.45 mL/min — AB (ref >60.00)
Glucose, Bld: 114 mg/dL — ABNORMAL HIGH (ref 70–99)
POTASSIUM: 5.7 meq/L — AB (ref 3.5–5.1)
Sodium: 136 mEq/L (ref 135–145)
Total Bilirubin: 0.4 mg/dL (ref 0.2–1.2)
Total Protein: 7.5 g/dL (ref 6.0–8.3)

## 2017-04-01 LAB — MICROALBUMIN / CREATININE URINE RATIO
CREATININE, U: 75 mg/dL
MICROALB UR: 37.8 mg/dL — AB (ref 0.0–1.9)
MICROALB/CREAT RATIO: 50.4 mg/g — AB (ref 0.0–30.0)

## 2017-04-01 LAB — URIC ACID: URIC ACID, SERUM: 7.1 mg/dL (ref 4.0–7.8)

## 2017-04-01 LAB — VITAMIN B12: Vitamin B-12: 371 pg/mL (ref 211–911)

## 2017-04-01 LAB — HEMOGLOBIN A1C: HEMOGLOBIN A1C: 6.5 % (ref 4.6–6.5)

## 2017-04-07 DIAGNOSIS — Z01818 Encounter for other preprocedural examination: Secondary | ICD-10-CM | POA: Insufficient documentation

## 2017-05-13 ENCOUNTER — Other Ambulatory Visit: Payer: Self-pay | Admitting: Family Medicine

## 2017-05-20 ENCOUNTER — Ambulatory Visit (AMBULATORY_SURGERY_CENTER): Payer: Self-pay | Admitting: *Deleted

## 2017-05-20 VITALS — Ht 67.0 in | Wt 222.0 lb

## 2017-05-20 DIAGNOSIS — Z1211 Encounter for screening for malignant neoplasm of colon: Secondary | ICD-10-CM

## 2017-05-20 DIAGNOSIS — L259 Unspecified contact dermatitis, unspecified cause: Secondary | ICD-10-CM | POA: Insufficient documentation

## 2017-05-20 DIAGNOSIS — F1011 Alcohol abuse, in remission: Secondary | ICD-10-CM | POA: Insufficient documentation

## 2017-05-20 DIAGNOSIS — K760 Fatty (change of) liver, not elsewhere classified: Secondary | ICD-10-CM | POA: Insufficient documentation

## 2017-05-20 NOTE — Progress Notes (Signed)
Patient denies any allergies to eggs or soy. Patient denies any problems with anesthesia/sedation. Patient denies any oxygen use at home and does not take any diet/weight loss medications. EMMI education assisgned to patient on colonoscopy, this was explained and instructions given to patient. 

## 2017-05-30 ENCOUNTER — Encounter: Payer: Self-pay | Admitting: Internal Medicine

## 2017-06-03 ENCOUNTER — Encounter: Payer: Self-pay | Admitting: Internal Medicine

## 2017-06-03 ENCOUNTER — Ambulatory Visit (AMBULATORY_SURGERY_CENTER): Payer: BLUE CROSS/BLUE SHIELD | Admitting: Internal Medicine

## 2017-06-03 VITALS — BP 131/77 | HR 61 | Temp 98.2°F | Resp 12 | Ht 67.0 in | Wt 226.0 lb

## 2017-06-03 DIAGNOSIS — Z1211 Encounter for screening for malignant neoplasm of colon: Secondary | ICD-10-CM | POA: Diagnosis not present

## 2017-06-03 DIAGNOSIS — Z1212 Encounter for screening for malignant neoplasm of rectum: Secondary | ICD-10-CM

## 2017-06-03 DIAGNOSIS — D123 Benign neoplasm of transverse colon: Secondary | ICD-10-CM

## 2017-06-03 MED ORDER — SODIUM CHLORIDE 0.9 % IV SOLN: 500.0000 mL | INTRAVENOUS | Status: DC

## 2017-06-03 NOTE — Patient Instructions (Addendum)
I found and removed one tiny polyp that looks benign. I will let you know pathology results and when to have another routine colonoscopy by mail and/or My Chart.  Good luck with your kidney transplant.  I appreciate the opportunity to care for you. Gatha Mayer, MD, [facility]   Discharge instructions given. Handout on polyps. Resume previous medications. YOU HAD AN ENDOSCOPIC PROCEDURE TODAY AT [facility]:   Refer to the procedure report that was given to you for any specific questions about what was found during the examination.  If the procedure report does not answer your questions, please call your gastroenterologist to clarify.  If you requested that your care partner not be given the details of your procedure findings, then the procedure report has been included in a sealed envelope for you to review at your convenience later.  YOU SHOULD EXPECT: Some feelings of bloating in the abdomen. Passage of more gas than usual.  Walking can help get rid of the air that was put into your GI tract during the procedure and reduce the bloating. If you had a lower endoscopy (such as a colonoscopy or flexible sigmoidoscopy) you may notice spotting of blood in your stool or on the toilet paper. If you underwent a bowel prep for your procedure, you may not have a normal bowel movement for a few days.  Please Note:  You might notice some irritation and congestion in your nose or some drainage.  This is from the oxygen used during your procedure.  There is no need for concern and it should clear up in a day or so.  SYMPTOMS TO REPORT IMMEDIATELY:   Following lower endoscopy (colonoscopy or flexible sigmoidoscopy):  Excessive amounts of blood in the stool  Significant tenderness or worsening of abdominal pains  Swelling of the abdomen that is new, acute  Fever of 100F or higher   For urgent or emergent issues, a gastroenterologist can be reached at any hour by calling [phone]  [phone].   DIET:  We do recommend a small meal at first, but then you may proceed to your regular diet.  Drink plenty of fluids but you should avoid alcoholic beverages for 24 hours.  ACTIVITY:  You should plan to take it easy for the rest of today and you should NOT DRIVE or use heavy machinery until tomorrow (because of the sedation medicines used during the test).    FOLLOW UP: Our staff will call the number listed on your records the next business day following your procedure to check on you and address any questions or concerns that you may have regarding the information given to you following your procedure. If we do not reach you, we will leave a message.  However, if you are feeling well and you are not experiencing any problems, there is no need to return our call.  We will assume that you have returned to your regular daily activities without incident.  If any biopsies were taken you will be contacted by phone or by letter within the next 1-3 weeks.  Please call us at [phone] if you have not heard about the biopsies in 3 weeks.    SIGNATURES/CONFIDENTIALITY: You and/or your care partner have signed paperwork which will be entered into your electronic medical record.  These signatures attest to the fact that that the information above on your After Visit Summary has been reviewed and is understood.  Full responsibility of the confidentiality of this discharge information  lies with you and/or your care-partner.

## 2017-06-03 NOTE — Progress Notes (Signed)
Called to room to assist during endoscopic procedure.  Patient ID and intended procedure confirmed with present staff. Received instructions for my participation in the procedure from the performing physician.  

## 2017-06-03 NOTE — Op Note (Signed)
[facility] Patient Name: Rodney [name] Procedure Date: 06/03/2017 7:50 AM MRN: [id number] Endoscopist: Gatha Mayer , MD Age: 51 Referring MD:  Date of Birth: 06-04-1966 Gender: Male Account #: [id number] Procedure:                Colonoscopy Indications:              Screening for colorectal malignant neoplasm, This                            is the patient's first colonoscopy Medicines:                Propofol per Anesthesia, Monitored Anesthesia Care Procedure:                Pre-Anesthesia Assessment:                           - Prior to the procedure, a History and Physical                            was performed, and patient medications and                            allergies were reviewed. The patient's tolerance of                            previous anesthesia was also reviewed. The risks                            and benefits of the procedure and the sedation                            options and risks were discussed with the patient.                            All questions were answered, and informed consent                            was obtained. Prior Anticoagulants: The patient has                            taken no previous anticoagulant or antiplatelet                            agents. ASA Grade Assessment: III - A patient with                            severe systemic disease. After reviewing the risks                            and benefits, the patient was deemed in                            satisfactory condition to undergo the procedure.  After obtaining informed consent, the colonoscope                            was passed under direct vision. Throughout the                            procedure, the patient's blood pressure, pulse, and                            oxygen saturations were monitored continuously. The                            Colonoscope was introduced through the anus and   advanced to the the cecum, identified by                            appendiceal orifice and ileocecal valve. The                            colonoscopy was performed without difficulty. The                            patient tolerated the procedure well. The quality                            of the bowel preparation was excellent. The                            ileocecal valve, appendiceal orifice, and rectum                            were photographed. The bowel preparation used was                            Miralax. Scope In: 8:09:48 AM Scope Out: 8:23:08 AM Scope Withdrawal Time: 0 hours 11 minutes 17 seconds  Total Procedure Duration: 0 hours 13 minutes 20 seconds  Findings:                 The perianal and digital rectal examinations were                            normal. Pertinent negatives include normal prostate                            (size, shape, and consistency).                           A diminutive polyp was found in the transverse                            colon. The polyp was sessile. The polyp was removed                            with a cold snare. Resection and retrieval were  complete. Verification of patient identification                            for the specimen was done. Estimated blood loss was                            minimal.                           The exam was otherwise without abnormality on                            direct and retroflexion views. Complications:            No immediate complications. Estimated Blood Loss:     Estimated blood loss was minimal. Impression:               - One diminutive polyp in the transverse colon,                            removed with a cold snare. Resected and retrieved.                           - The examination was otherwise normal on direct                            and retroflexion views.                           - also send copy and path to:                            Merrie Roof, [place]                           [place],  [address]                           Phone: [phone]                           Fax: [phone] Recommendation:           - Patient has a contact number available for                            emergencies. The signs and symptoms of potential                            delayed complications were discussed with the                            patient. Return to normal activities tomorrow.  Written discharge instructions were provided to the                            patient.                           - Resume previous diet.                           - Continue present medications.                           - Repeat colonoscopy is recommended. The                            colonoscopy date will be determined after pathology                            results from today's exam become available for                            review. Gatha Mayer, MD 06/03/2017 8:30:06 AM This report has been signed electronically.

## 2017-06-03 NOTE — Progress Notes (Signed)
Report to PACU, RN, vss, BBS= Clear.  

## 2017-06-03 NOTE — Progress Notes (Signed)
Pt's states no medical or surgical changes since previsit or office visit. 

## 2017-06-06 ENCOUNTER — Telehealth: Payer: Self-pay | Admitting: *Deleted

## 2017-06-06 NOTE — Telephone Encounter (Signed)
  Follow up Call-  Call back number 06/03/2017  Post procedure Call Back phone  # [phone]  Permission to leave phone message Yes  Some recent data might be hidden     Patient questions:  Message left to call us if necessary.

## 2017-06-06 NOTE — Telephone Encounter (Signed)
  Follow up Call-  Call back number 06/03/2017  Post procedure Call Back phone  # [phone]  Permission to leave phone message Yes  Some recent data might be hidden     Patient questions:  Message left to call if necessary.  Second call.

## 2017-06-08 ENCOUNTER — Encounter: Payer: Self-pay | Admitting: Internal Medicine

## 2017-06-08 DIAGNOSIS — Z86010 Personal history of colonic polyps: Secondary | ICD-10-CM

## 2017-06-08 DIAGNOSIS — Z860101 Personal history of adenomatous and serrated colon polyps: Secondary | ICD-10-CM | POA: Insufficient documentation

## 2017-06-08 HISTORY — DX: Personal history of adenomatous and serrated colon polyps: Z86.0101

## 2017-06-08 HISTORY — DX: Personal history of colonic polyps: Z86.010

## 2017-06-08 NOTE — Progress Notes (Signed)
1 adenoma recall 2023 My Chart letter Please also fax a copy to the transplant NP   Merrie Roof, [facility] [place], [place] [address]                           Phone: [phone]                           Fax: [phone]

## 2017-06-30 IMAGING — CT CT ABD-PELV W/O CM
2 of 4 series · 17 of 46 positions shown, 19 images · non-contrast
Comparison: 08/16/2015

CLINICAL DATA: Epigastric pain for 9 days

EXAM:
CT ABDOMEN AND PELVIS WITHOUT CONTRAST
TECHNIQUE: Multidetector CT imaging of the abdomen and pelvis was performed
following the standard protocol without IV contrast.

[Series 2: a/p w/o 5mm · axial · non-contrast · 0.80mm/px · z∈[-995,-525]mm · 14 of 104 slices shown, 16 images]
[im 5/104  soft-tissue]
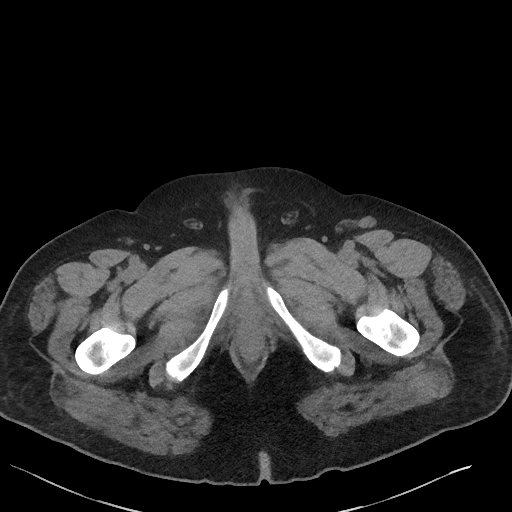
[im 5/104  bone]
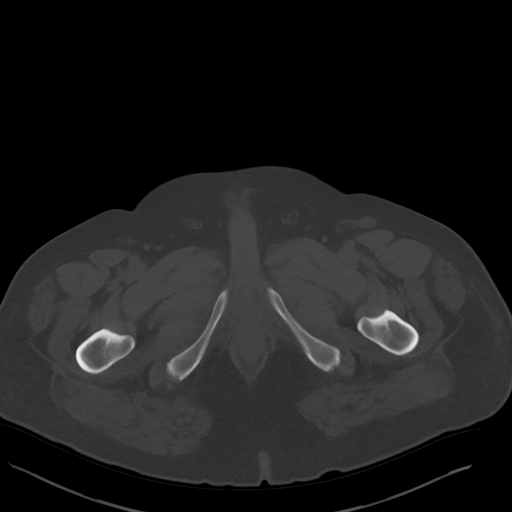
[im 13/104  soft-tissue]
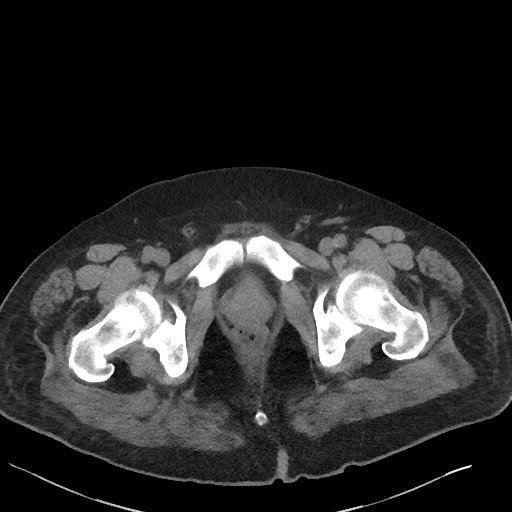
[im 22/104  soft-tissue]
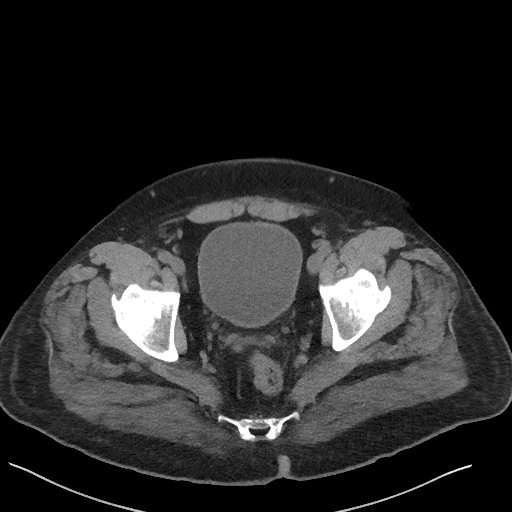
[im 26/104  soft-tissue]
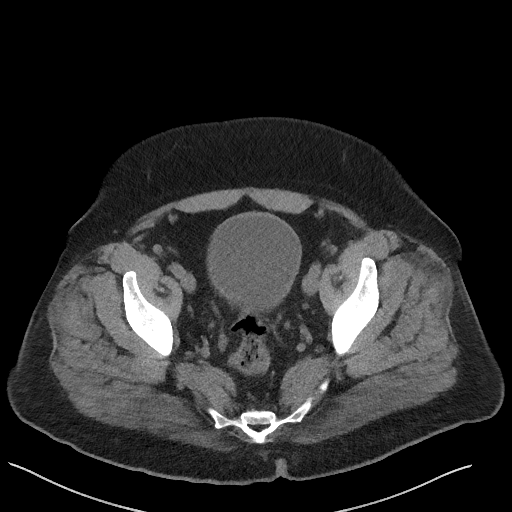
[im 35/104  soft-tissue]
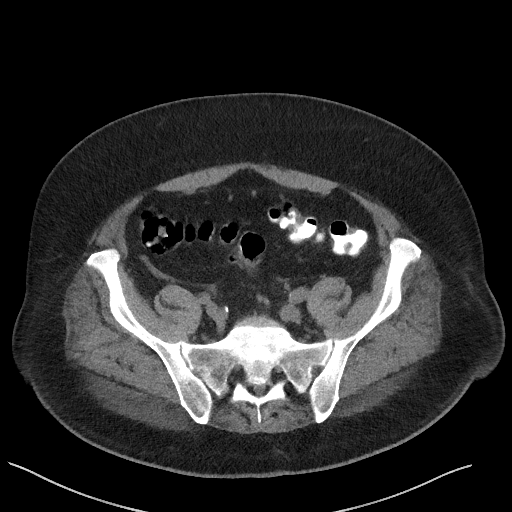
[im 43/104  soft-tissue]
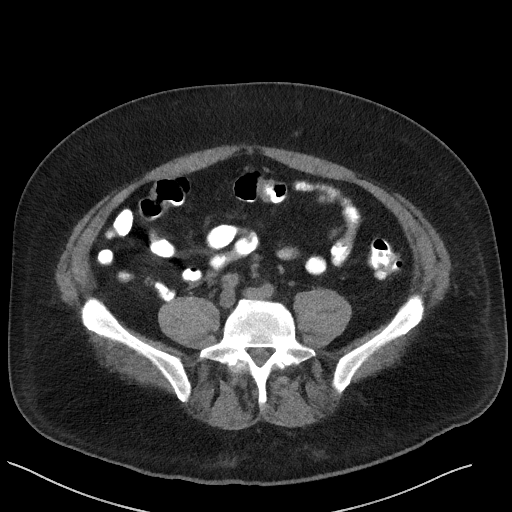
[im 48/104  soft-tissue]
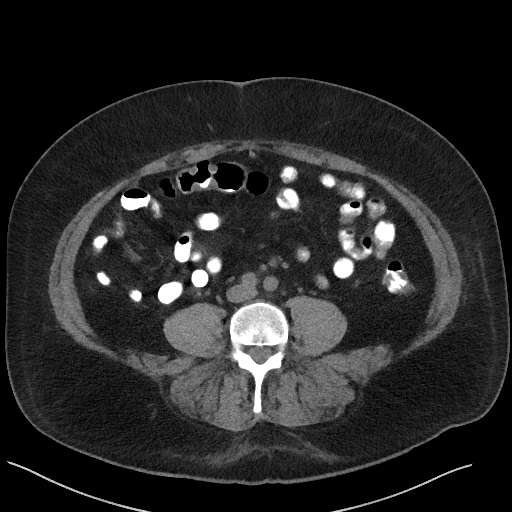
[im 56/104  soft-tissue]
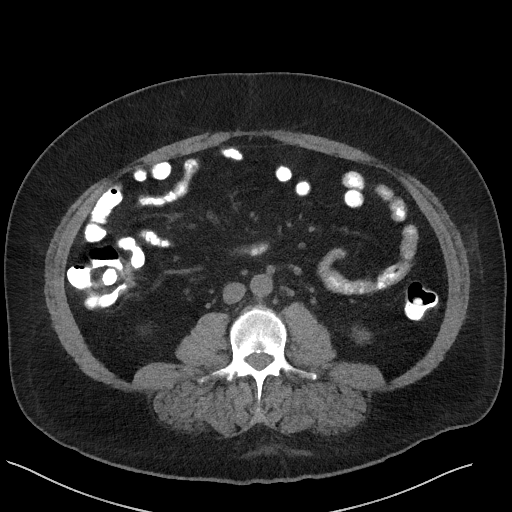
[im 61/104  soft-tissue]
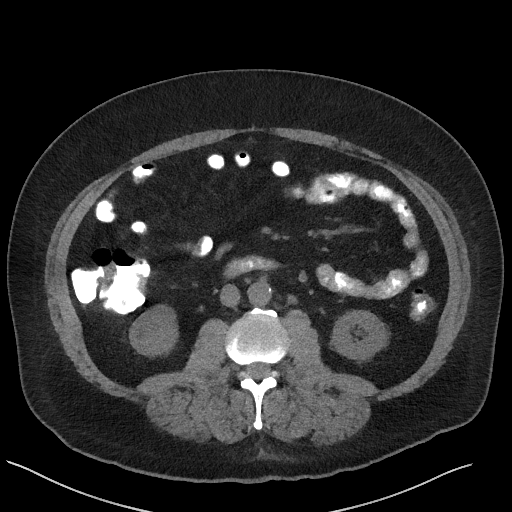
[im 61/104  bone]
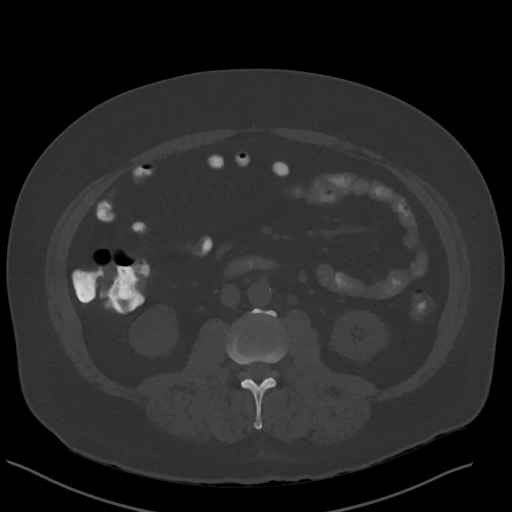
[im 69/104  soft-tissue]
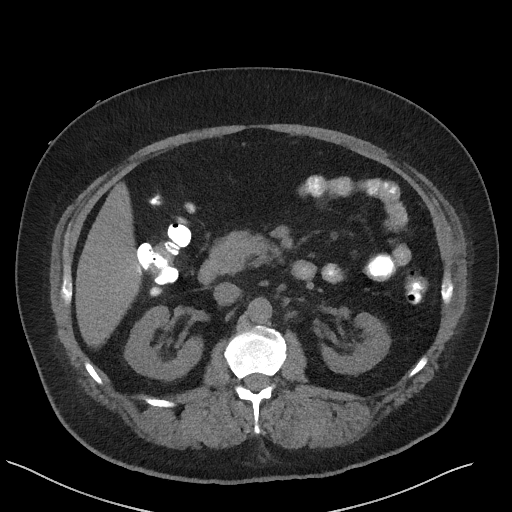
[im 78/104  soft-tissue]
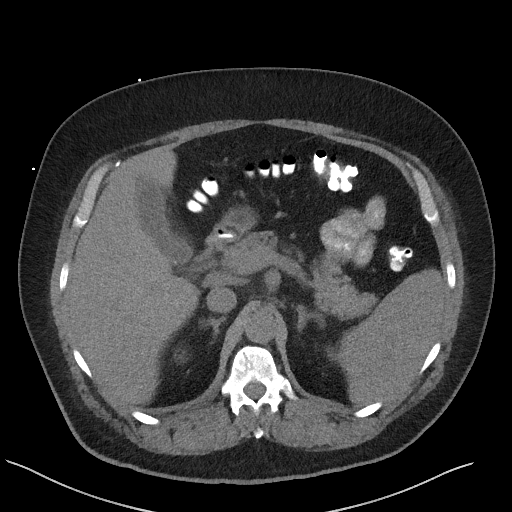
[im 82/104  soft-tissue]
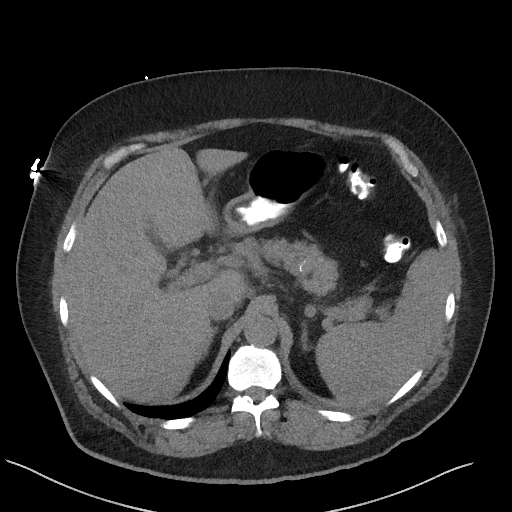
[im 91/104  soft-tissue]
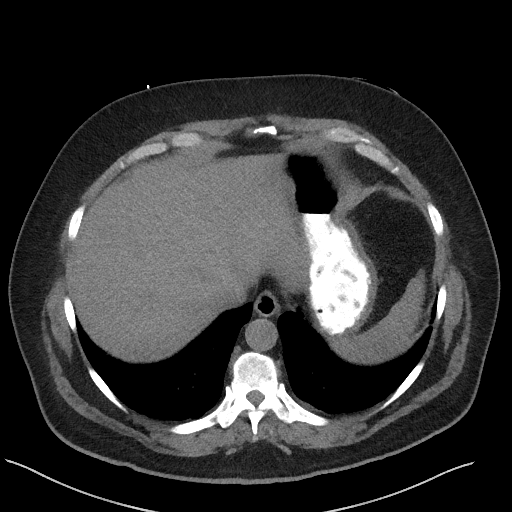
[im 99/104  soft-tissue]
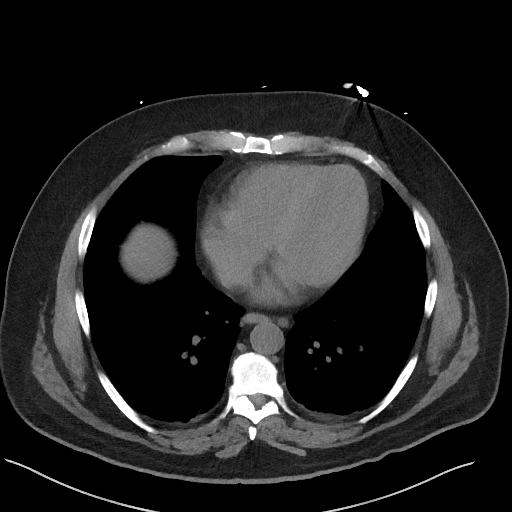

[Series 5: a/p w/o cor · coronal · non-contrast · 0.91mm/px · 3 of 161 slices shown]
[im 54/161  soft-tissue]
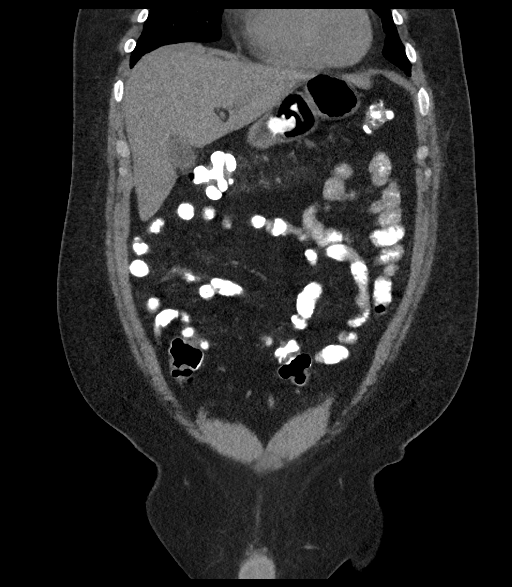
[im 72/161  soft-tissue]
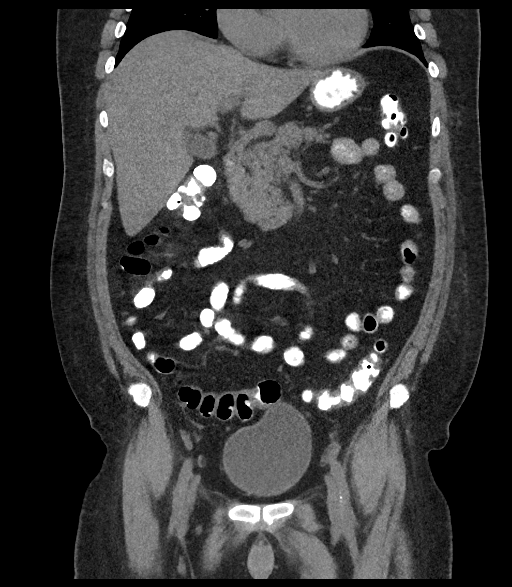
[im 89/161  soft-tissue]
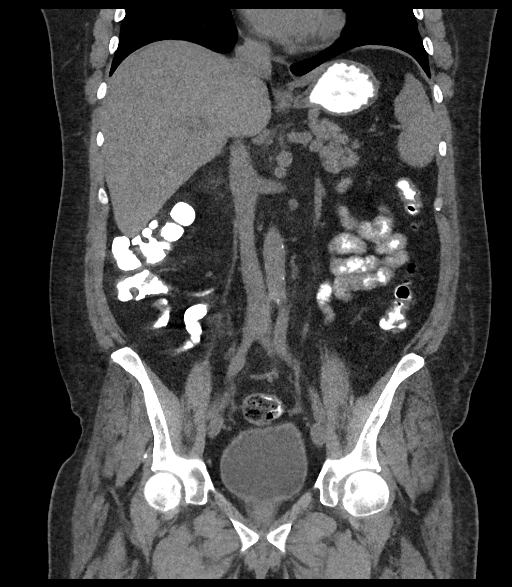

[17 of 46 positions shown; findings below may reference images not displayed]

FINDINGS: Lung bases demonstrate some minimal dependent atelectasis. No focal
confluent infiltrate is seen.

The liver, gallbladder, spleen, adrenal glands and pancreas are
within normal limits. The fatty changes of the liver are not well
appreciated on this exam. The kidneys demonstrate a vague
hypodensities consistent with a cortical cysts seen previously. No
obstructive changes in the large and small bowel are noted. The
appendix is within normal limits. Scattered diverticular change is
seen without diverticulitis.

The bladder is well distended. No pelvic mass lesion or sidewall
abnormality is noted. No significant lymphadenopathy is seen. No
acute bony abnormality is seen.
IMPRESSION: Chronic changes as described above.  No acute abnormality is noted.

## 2017-07-26 ENCOUNTER — Telehealth: Payer: Self-pay | Admitting: Family Medicine

## 2017-07-26 NOTE — Telephone Encounter (Signed)
Caller name:Kirchman,Constance Relation to pt: spouse  Call back number: [phone] Pharmacy:  Reason for call:  Spouse requesting orders for the second shingle injection, second hep A & B injection and  pneumonia orders, please advise

## 2017-07-26 NOTE — Telephone Encounter (Signed)
Left message on machine to call back. Confused if pt is needing to schedule a nurse visit for these immunizations or does he want a rx sent to the pharmacy

## 2017-07-27 NOTE — Telephone Encounter (Signed)
Patient requesting orders to schedule nurse visit only.

## 2017-07-27 NOTE — Telephone Encounter (Signed)
Please see previous message below and verify if ok for pt to come in for a nurse only visit for immunizations

## 2017-07-28 NOTE — Telephone Encounter (Signed)
In reviewing patient's immunization in chart the following was noted:  Shingrix received on 03/31/17 (1st dose).  Shingrix due before 09/17/17 Hep B received on 03/31/17 (1st dose).  No record of patient receiving Hep A and B.  Also patient was suppose to receive 2nd dose 1 month after 1st injection.   Pneumo 23 received on 03/31/17.  Pt is not due for another pneumonia vaccine.  Dr. Carmelina Noun review and advise.

## 2017-07-28 NOTE — Telephone Encounter (Signed)
Noted. Please call patient and schedule.

## 2017-07-28 NOTE — Telephone Encounter (Signed)
We can give hep a/b --- he should have both-- he is on kidney transplant list He can get prevnar 13 in a year Ok to give shingrix, hep a/b

## 2017-07-28 NOTE — Telephone Encounter (Signed)
lvm advising patient of message below °

## 2017-07-28 NOTE — Telephone Encounter (Signed)
Ok for nurse visit

## 2017-08-02 NOTE — Telephone Encounter (Signed)
Left message on machine to call back to assure he received vm from previous message

## 2017-08-09 NOTE — Telephone Encounter (Signed)
Patient scheduled nurse visit for 08/12/2017 with nurse to receive   prevnar 13 in a year, 2nd dose shingrix, hep a/b

## 2017-08-09 NOTE — Telephone Encounter (Signed)
Noted. Thanks.

## 2017-08-12 ENCOUNTER — Ambulatory Visit (INDEPENDENT_AMBULATORY_CARE_PROVIDER_SITE_OTHER): Payer: BLUE CROSS/BLUE SHIELD | Admitting: Behavioral Health

## 2017-08-12 DIAGNOSIS — Z23 Encounter for immunization: Secondary | ICD-10-CM | POA: Diagnosis not present

## 2017-08-12 NOTE — Progress Notes (Signed)
Pre visit review using our clinic review tool, if applicable. No additional management support is needed unless otherwise documented below in the visit note.  Patient came in clinic today for Shingrix & Hepatitis A/B vaccinations. Both injections were tolerated well. No s/s of a reaction prior to patient leaving the nurse visit. Patient will call the office a later date to schedule his next appointment.

## 2017-12-07 ENCOUNTER — Ambulatory Visit (INDEPENDENT_AMBULATORY_CARE_PROVIDER_SITE_OTHER): Payer: BLUE CROSS/BLUE SHIELD

## 2017-12-07 DIAGNOSIS — Z23 Encounter for immunization: Secondary | ICD-10-CM

## 2018-02-13 ENCOUNTER — Encounter (HOSPITAL_BASED_OUTPATIENT_CLINIC_OR_DEPARTMENT_OTHER): Payer: Self-pay | Admitting: Emergency Medicine

## 2018-02-13 ENCOUNTER — Ambulatory Visit: Payer: Self-pay | Admitting: *Deleted

## 2018-02-13 ENCOUNTER — Other Ambulatory Visit: Payer: Self-pay

## 2018-02-13 ENCOUNTER — Emergency Department (HOSPITAL_BASED_OUTPATIENT_CLINIC_OR_DEPARTMENT_OTHER)
Admission: EM | Admit: 2018-02-13 | Discharge: 2018-02-13 | Disposition: A | Discharge status: Home / Self Care | Payer: BLUE CROSS/BLUE SHIELD | Attending: Emergency Medicine | Admitting: Emergency Medicine

## 2018-02-13 DIAGNOSIS — Z79899 Other long term (current) drug therapy: Secondary | ICD-10-CM | POA: Diagnosis not present

## 2018-02-13 DIAGNOSIS — E1122 Type 2 diabetes mellitus with diabetic chronic kidney disease: Secondary | ICD-10-CM | POA: Diagnosis not present

## 2018-02-13 DIAGNOSIS — E1165 Type 2 diabetes mellitus with hyperglycemia: Secondary | ICD-10-CM | POA: Insufficient documentation

## 2018-02-13 DIAGNOSIS — I129 Hypertensive chronic kidney disease with stage 1 through stage 4 chronic kidney disease, or unspecified chronic kidney disease: Secondary | ICD-10-CM | POA: Diagnosis not present

## 2018-02-13 DIAGNOSIS — N184 Chronic kidney disease, stage 4 (severe): Secondary | ICD-10-CM | POA: Insufficient documentation

## 2018-02-13 DIAGNOSIS — R739 Hyperglycemia, unspecified: Secondary | ICD-10-CM

## 2018-02-13 LAB — CBC WITH DIFFERENTIAL/PLATELET
BASOS ABS: 0 10*3/uL (ref 0.0–0.1)
Basophils Relative: 0 %
EOS PCT: 4 %
Eosinophils Absolute: 0.6 10*3/uL (ref 0.0–0.7)
HCT: 33.9 % — ABNORMAL LOW (ref 39.0–52.0)
Hemoglobin: 12.1 g/dL — ABNORMAL LOW (ref 13.0–17.0)
LYMPHS ABS: 1.3 10*3/uL (ref 0.7–4.0)
Lymphocytes Relative: 8 %
MCH: 28.9 pg (ref 26.0–34.0)
MCHC: 35.7 g/dL (ref 30.0–36.0)
MCV: 81.1 fL (ref 78.0–100.0)
MONO ABS: 1.3 10*3/uL — AB (ref 0.1–1.0)
MONOS PCT: 8 %
NEUTROS PCT: 80 %
Neutro Abs: 12.5 10*3/uL — ABNORMAL HIGH (ref 1.7–7.7)
PLATELETS: 225 10*3/uL (ref 150–400)
RBC: 4.18 MIL/uL — AB (ref 4.22–5.81)
RDW: 13.5 % (ref 11.5–15.5)
WBC: 15.7 10*3/uL — AB (ref 4.0–10.5)

## 2018-02-13 LAB — COMPREHENSIVE METABOLIC PANEL
ALBUMIN: 3.9 g/dL (ref 3.5–5.0)
ALT: 20 U/L (ref 17–63)
AST: 20 U/L (ref 15–41)
Alkaline Phosphatase: 127 U/L — ABNORMAL HIGH (ref 38–126)
Anion gap: 14 (ref 5–15)
BUN: 95 mg/dL — AB (ref 6–20)
CHLORIDE: 91 mmol/L — AB (ref 101–111)
CO2: 17 mmol/L — ABNORMAL LOW (ref 22–32)
Calcium: 9.6 mg/dL (ref 8.9–10.3)
Creatinine, Ser: 4.5 mg/dL — ABNORMAL HIGH (ref 0.61–1.24)
GFR calc Af Amer: 16 mL/min — ABNORMAL LOW (ref >60)
GFR calc non Af Amer: 14 mL/min — ABNORMAL LOW (ref >60)
GLUCOSE: 453 mg/dL — AB (ref 65–99)
POTASSIUM: 3.9 mmol/L (ref 3.5–5.1)
SODIUM: 122 mmol/L — AB (ref 135–145)
Total Bilirubin: 0.4 mg/dL (ref 0.3–1.2)
Total Protein: 8.4 g/dL — ABNORMAL HIGH (ref 6.5–8.1)

## 2018-02-13 LAB — URINALYSIS, ROUTINE W REFLEX MICROSCOPIC
Bilirubin Urine: NEGATIVE
Glucose, UA: >=500 mg/dL — AB
Hgb urine dipstick: NEGATIVE
KETONES UR: NEGATIVE mg/dL
LEUKOCYTES UA: NEGATIVE
NITRITE: NEGATIVE
PROTEIN: NEGATIVE mg/dL
Specific Gravity, Urine: 1.01 (ref 1.005–1.030)
pH: 6 (ref 5.0–8.0)

## 2018-02-13 LAB — CBG MONITORING, ED
GLUCOSE-CAPILLARY: 410 mg/dL — AB (ref 65–99)
Glucose-Capillary: 401 mg/dL — ABNORMAL HIGH (ref 65–99)
Glucose-Capillary: 365 mg/dL — ABNORMAL HIGH (ref 65–99)

## 2018-02-13 LAB — URINALYSIS, MICROSCOPIC (REFLEX): WBC, UA: NONE SEEN WBC/hpf (ref 0–5)

## 2018-02-13 MED ORDER — INSULIN ASPART PROT & ASPART (70-30 MIX) 100 UNIT/ML ~~LOC~~ SUSP
8.0000 [IU] | Freq: Once | SUBCUTANEOUS | Status: AC
  Administered 2018-02-13: 8 [IU] via SUBCUTANEOUS

## 2018-02-13 MED ORDER — INSULIN ASPART PROT & ASPART (70-30 MIX) 100 UNIT/ML ~~LOC~~ SUSP
SUBCUTANEOUS | Status: AC
  Filled 2018-02-13: qty 10

## 2018-02-13 MED ORDER — SODIUM CHLORIDE 0.9 % IV BOLUS
500.0000 mL | Freq: Once | INTRAVENOUS | Status: AC
Start: 2018-02-13 — End: 2018-02-13
  Administered 2018-02-13: 500 mL via INTRAVENOUS

## 2018-02-13 NOTE — Telephone Encounter (Signed)
Pt called with his blood sugar being up to 590 after eating a lot of sweets a couple days ago. He stated that he just felt weird and wanted to see what his blood sugar was. He stated that he is in kidney failure and is on a diuretic and does not really go to the bathroom frequently.  His blood sugar right now is 457 after eating an apple at 1430.  Pt denying any symptoms right now. Advised to go to the emergency department to be assessed. Pt voiced understanding. He is going right now. Flow at [facility] notified. Pt also advised to make an appointment with his pcp. He voiced understanding.  Reason for Disposition . Blood glucose > 400 mg/dl (22 mmol/l)  Answer Assessment - Initial Assessment Questions 1. BLOOD GLUCOSE: "What is your blood glucose level?"      348 this morning (fast) and now 457 2. ONSET: "When did you check the blood glucose?"     Now  3. USUAL RANGE: "What is your glucose level usually?" (e.g., usual fasting morning value, usual evening value)     Had not been checking 4. KETONES: "Do you check for ketones (urine or blood test strips)?" If yes, ask: "What does the test show now?"      no 5. TYPE 1 or 2:  "Do you know what type of diabetes you have?"  (e.g., Type 1, Type 2, Gestational; doesn't know)      Not sure 6. INSULIN: "Do you take insulin?" If yes, ask: "Have you missed any shots recently?"     no 7. DIABETES PILLS: "Do you take any pills for your diabetes?" If yes, ask: "Have you missed taking any pills recently?"     no 8. OTHER SYMPTOMS: "Do you have any symptoms?" (e.g., fever, frequent urination, difficulty breathing, dizziness, weakness, vomiting)     no 9. PREGNANCY: "Is there any chance you are pregnant?" "When was your last menstrual period?" no  Protocols used: DIABETES - HIGH BLOOD SUGAR-A-AH

## 2018-02-13 NOTE — ED Provider Notes (Signed)
Emergency Department Provider Note   I have reviewed the triage vital signs and the nursing notes.   HISTORY  Chief Complaint Hyperglycemia   HPI Rodney [name] is a 52 y.o. male with PMH of CKD (stage 4), HLD, HTN, and DM to the emergency department for evaluation of elevated blood sugars at home with increased urination and intermittent blurry vision.  The patient denies any abdominal pain, nausea, vomiting. No fever/chills.  He was at one time taking metformin for his diabetes but had to discontinue this medication because of his worsening kidney dysfunction.  He continues to make urine and is not on dialysis but is on the renal transplant list.  He does not take insulin or other oral hyperglycemic medication.  When he began having intermittent blurry vision he reordered his strips and began testing his blood sugars at home which have been in the 300s range over the last week. Called his PCP and was referred to the ED for further evaluation.   Past Medical History:  Diagnosis Date  . Chronic kidney disease    STAGE 4/on transplant list for Kidneys  . Diabetes mellitus    Type 2; diet controlled  . Gout   . Hx of adenomatous polyp of colon 06/08/2017  . Hyperlipidemia   . Hypertension     Patient Active Problem List   Diagnosis Date Noted  . Hx of adenomatous polyp of colon 06/08/2017  . Contact dermatitis 05/20/2017  . H/O ETOH abuse 05/20/2017  . Hepatic steatosis 05/20/2017  . Pre-transplant evaluation for kidney transplant 04/07/2017  . Stage 4 chronic kidney disease ([place]) 03/31/2017  . Elevated LFTs 08/20/2015  . Gastritis 08/17/2015  . Acute-on-chronic kidney injury ([place]) 08/16/2015  . Jaundice 08/16/2015  . DM (diabetes mellitus) ([place]) 08/16/2015  . Obesity (BMI 30-39.9) 02/11/2014  . B12 deficiency 08/01/2012  . Obesity 08/01/2012  . Numbness of fingers of both hands 05/26/2011  . Preventative health care 02/16/2011  . Hyperlipidemia 02/22/2008  . ABNORMAL  RESULT, FUNCTION STUDY, KIDNEY 05/24/2007  . Gout 02/22/2007  . Hypertension 02/22/2007    Past Surgical History:  Procedure Laterality Date  . ESOPHAGOGASTRODUODENOSCOPY N/A 08/17/2015   Procedure: ESOPHAGOGASTRODUODENOSCOPY (EGD);  Surgeon: Wilford Corner, MD;  [facility];  Service: Endoscopy;  Laterality: N/A;    Current Outpatient Rx  . Order #: [id number] Class: Historical Med  . Order #: [id number] Class: Historical Med  . Order #: [id number] Class: Historical Med  . Order #: [id number] Class: Normal  . Order #: [id number] Class: Normal  . Order #: [id number] Class: Print  . Order #: [id number] Class: Historical Med  . Order #: [id number] Class: Print  . Order #: [id number] Class: Historical Med  . Order #: [id number] Class: Normal    Allergies Simvastatin  Family History  Problem Relation Age of Onset  . Heart disease Mother 15       CHF, stent,   . Cancer Mother 74       breast  . Stroke Father   . Hypertension Father   . Hyperlipidemia Father   . Heart disease Maternal Aunt   . Heart disease Maternal Uncle   . Colon cancer Neg Hx     Social History Social History   Tobacco Use  . Smoking status: Never Smoker  . Smokeless tobacco: Never Used  Substance Use Topics  . Alcohol use: No  . Drug use: No    Review of Systems  Constitutional: No fever/chills Eyes: Intermittent blurry vision (none currently).  ENT: No sore throat. Cardiovascular: Denies  chest pain. Respiratory: Denies shortness of breath. Gastrointestinal: No abdominal pain.  No nausea, no vomiting.  No diarrhea.  No constipation. Genitourinary: Negative for dysuria. Polyuria.  Musculoskeletal: Negative for back pain. Skin: Negative for rash. Neurological: Negative for headaches, focal weakness or numbness.  10-point ROS otherwise negative.  ____________________________________________   PHYSICAL EXAM:  VITAL SIGNS: ED Triage Vitals  Enc Vitals Group     BP 02/13/18 1637 (!) 145/96      Pulse Rate 02/13/18 1637 82     Resp 02/13/18 1637 18     Temp 02/13/18 1637 98.9 F (37.2 C)     Temp Source 02/13/18 1637 Oral     SpO2 02/13/18 1637 100 %     Weight 02/13/18 1638 225 lb (102.1 kg)     Height 02/13/18 1638 5\' 7"  (1.702 m)     Pain Score 02/13/18 1638 0   Constitutional: Alert and oriented. Well appearing and in no acute distress. Eyes: Conjunctivae are normal. PERRL. Head: Atraumatic. Nose: No congestion/rhinnorhea. Mouth/Throat: Mucous membranes are moist.   Neck: No stridor.   Cardiovascular: Normal rate, regular rhythm. Good peripheral circulation. Grossly normal heart sounds.   Respiratory: Normal respiratory effort.  No retractions. Lungs CTAB. Gastrointestinal: Soft and nontender. No distention.  Musculoskeletal: No lower extremity tenderness nor edema. No gross deformities of extremities. Neurologic:  Normal speech and language. No gross focal neurologic deficits are appreciated.  Skin:  Skin is warm, dry and intact. No rash noted.  ____________________________________________   LABS (all labs ordered are listed, but only abnormal results are displayed)  Labs Reviewed  COMPREHENSIVE METABOLIC PANEL - Abnormal; Notable for the following components:      Result Value   Sodium 122 (*)    Chloride 91 (*)    CO2 17 (*)    Glucose, Bld 453 (*)    BUN 95 (*)    Creatinine, Ser 4.50 (*)    Total Protein 8.4 (*)    Alkaline Phosphatase 127 (*)    GFR calc non Af Amer 14 (*)    GFR calc Af Amer 16 (*)    All other components within normal limits  CBC WITH DIFFERENTIAL/PLATELET - Abnormal; Notable for the following components:   WBC 15.7 (*)    RBC 4.18 (*)    Hemoglobin 12.1 (*)    HCT 33.9 (*)    Neutro Abs 12.5 (*)    Monocytes Absolute 1.3 (*)    All other components within normal limits  URINALYSIS, ROUTINE W REFLEX MICROSCOPIC - Abnormal; Notable for the following components:   Glucose, UA >=500 (*)    All other components within normal  limits  URINALYSIS, MICROSCOPIC (REFLEX) - Abnormal; Notable for the following components:   Bacteria, UA RARE (*)    Squamous Epithelial / LPF 0-5 (*)    All other components within normal limits  CBG MONITORING, ED - Abnormal; Notable for the following components:   Glucose-Capillary 410 (*)    All other components within normal limits  CBG MONITORING, ED - Abnormal; Notable for the following components:   Glucose-Capillary 401 (*)    All other components within normal limits  CBG MONITORING, ED - Abnormal; Notable for the following components:   Glucose-Capillary 365 (*)    All other components within normal limits   ____________________________________________   PROCEDURES  Procedure(s) performed:   Procedures  None ____________________________________________   INITIAL IMPRESSION / ASSESSMENT AND PLAN / ED COURSE  Pertinent labs & imaging results that  were available during my care of the patient were reviewed by me and considered in my medical decision making (see chart for details).  Patient presents to the emergency department for evaluation of hyperglycemia at home.  He is in intermittent blurry vision but none currently.  No signs or symptoms to increase my suspicion significantly for DKA or HHS.  He does have a primary care physician but was referred here by them today with hyperglycemia.  Blood sugar on arrival was greater than 400.  Went for gentle IV fluids along with labs to rule out DKA.   Labs reviewed with no DKA. CBG down-trending slightly. Has appointment with PCP tomorrow to discuss meds/insulin. No meds from the ED at this time. Discussed low-carb diet and exercise. Discussed signs/symptoms of diabetes emergency.   At this time, I do not feel there is any life-threatening condition present. I have reviewed and discussed all results (EKG, imaging, lab, urine as appropriate), exam findings with patient. I have reviewed nursing notes and appropriate previous  records.  I feel the patient is safe to be discharged home without further emergent workup. Discussed usual and customary return precautions. Patient and family (if present) verbalize understanding and are comfortable with this plan.  Patient will follow-up with their primary care provider. If they do not have a primary care provider, information for follow-up has been provided to them. All questions have been answered.  ____________________________________________  FINAL CLINICAL IMPRESSION(S) / ED DIAGNOSES  Final diagnoses:  Hyperglycemia     MEDICATIONS GIVEN DURING THIS VISIT:  Medications  sodium chloride 0.9 % bolus 500 mL (0 mLs Intravenous Stopped 02/13/18 1759)  insulin aspart protamine- aspart (NOVOLOG MIX 70/30) injection 8 Units (8 Units Subcutaneous Given 02/13/18 1757)    Note:  This document was prepared using Dragon voice recognition software and may include unintentional dictation errors.  Nanda Quinton, MD Emergency Medicine    Long, Wonda [facility], MD 02/14/18 [phone]

## 2018-02-13 NOTE — ED Triage Notes (Addendum)
patient states that he has had increased thirst and higher blood sugars for the last week  - he reports that fort he last 2 weeks he has had blurred vision

## 2018-02-13 NOTE — Discharge Instructions (Signed)

## 2018-02-14 ENCOUNTER — Telehealth: Payer: Self-pay | Admitting: Family Medicine

## 2018-02-14 NOTE — Telephone Encounter (Unsigned)
Copied from [place] [id number]. Topic: Quick Communication - See Telephone Encounter >> Feb 14, 2018  8:45 AM Hewitt Shorts wrote: CRM for notification. See Telephone encounter for: 02/14/18. Pt did go [facility] last [facility] and pt wife says that they did bring pt sugar down from 410 to 350 or 357 and wife states that with his kidney disease he was not able to get a lot done with fluids and was told to see his PCP   Best number [phone] or [phone]  And the triage nurse asked yesterday for wife to call back with these details to know what to do next

## 2018-02-14 NOTE — Telephone Encounter (Signed)
Patient wife made an  appointment for Friday

## 2018-02-14 NOTE — Telephone Encounter (Signed)
He needs ov 

## 2018-02-17 ENCOUNTER — Ambulatory Visit: Payer: BLUE CROSS/BLUE SHIELD | Admitting: Family Medicine

## 2018-02-17 ENCOUNTER — Telehealth: Payer: Self-pay | Admitting: Emergency Medicine

## 2018-02-17 ENCOUNTER — Encounter: Payer: Self-pay | Admitting: Family Medicine

## 2018-02-17 VITALS — BP 132/80 | HR 73 | Temp 97.7°F | Resp 16 | Ht 67.0 in | Wt 229.0 lb

## 2018-02-17 DIAGNOSIS — E785 Hyperlipidemia, unspecified: Secondary | ICD-10-CM | POA: Diagnosis not present

## 2018-02-17 DIAGNOSIS — I129 Hypertensive chronic kidney disease with stage 1 through stage 4 chronic kidney disease, or unspecified chronic kidney disease: Secondary | ICD-10-CM | POA: Insufficient documentation

## 2018-02-17 DIAGNOSIS — R739 Hyperglycemia, unspecified: Secondary | ICD-10-CM | POA: Diagnosis not present

## 2018-02-17 DIAGNOSIS — E1165 Type 2 diabetes mellitus with hyperglycemia: Secondary | ICD-10-CM | POA: Insufficient documentation

## 2018-02-17 DIAGNOSIS — N184 Chronic kidney disease, stage 4 (severe): Secondary | ICD-10-CM

## 2018-02-17 DIAGNOSIS — I10 Essential (primary) hypertension: Secondary | ICD-10-CM | POA: Diagnosis not present

## 2018-02-17 LAB — COMPREHENSIVE METABOLIC PANEL
ALBUMIN: 4.1 g/dL (ref 3.5–5.2)
ALK PHOS: 95 U/L (ref 39–117)
ALT: 16 U/L (ref 0–53)
AST: 15 U/L (ref 0–37)
BILIRUBIN TOTAL: 0.6 mg/dL (ref 0.2–1.2)
BUN: 91 mg/dL (ref 6–23)
CALCIUM: 9.5 mg/dL (ref 8.4–10.5)
CO2: 20 mEq/L (ref 19–32)
CREATININE: 4.63 mg/dL — AB (ref 0.40–1.50)
Chloride: 95 mEq/L — ABNORMAL LOW (ref 96–112)
GFR: 14.13 mL/min — CL (ref >60.00)
Glucose, Bld: 311 mg/dL — ABNORMAL HIGH (ref 70–99)
Potassium: 4.2 mEq/L (ref 3.5–5.1)
Sodium: 129 mEq/L — ABNORMAL LOW (ref 135–145)
TOTAL PROTEIN: 7.5 g/dL (ref 6.0–8.3)

## 2018-02-17 LAB — POC URINALSYSI DIPSTICK (AUTOMATED)
Bilirubin, UA: NEGATIVE
Blood, UA: NEGATIVE
Ketones, UA: NEGATIVE
Leukocytes, UA: NEGATIVE
NITRITE UA: NEGATIVE
Spec Grav, UA: 1.02 (ref 1.010–1.025)
UROBILINOGEN UA: NEGATIVE U/dL — AB
pH, UA: 6 (ref 5.0–8.0)

## 2018-02-17 LAB — LIPID PANEL
CHOL/HDL RATIO: 7
CHOLESTEROL: 162 mg/dL (ref 0–200)
HDL: 23 mg/dL — ABNORMAL LOW (ref >39.00)
NonHDL: 139.13
TRIGLYCERIDES: 278 mg/dL — AB (ref 0.0–149.0)
VLDL: 55.6 mg/dL — ABNORMAL HIGH (ref 0.0–40.0)

## 2018-02-17 LAB — HEMOGLOBIN A1C: Hgb A1c MFr Bld: 12.7 % — ABNORMAL HIGH (ref 4.6–6.5)

## 2018-02-17 LAB — LDL CHOLESTEROL, DIRECT: LDL DIRECT: 92 mg/dL

## 2018-02-17 MED ORDER — INSULIN DEGLUDEC 100 UNIT/ML ~~LOC~~ SOPN: PEN_INJECTOR | SUBCUTANEOUS | 3 refills | Status: DC

## 2018-02-17 MED ORDER — INSULIN PEN NEEDLE 32G X 6 MM MISC: 2 refills | Status: DC

## 2018-02-17 MED ORDER — BLOOD GLUCOSE MONITOR KIT: PACK | 0 refills | Status: DC

## 2018-02-17 NOTE — Assessment & Plan Note (Signed)
Encouraged heart healthy diet, increase exercise, avoid trans fats, consider a krill oil cap daily 

## 2018-02-17 NOTE — Assessment & Plan Note (Signed)
tresiba 10 u daily Check bs 1-2 x a day Refer to endo ' Check labs

## 2018-02-17 NOTE — Assessment & Plan Note (Signed)
Well controlled, no changes to meds. Encouraged heart healthy diet such as the DASH diet and exercise as tolerated.  °

## 2018-02-17 NOTE — Telephone Encounter (Signed)
Pt sees [place] [facility] -- on transplant list

## 2018-02-17 NOTE — Assessment & Plan Note (Signed)
Per nephrology 

## 2018-02-17 NOTE — Telephone Encounter (Signed)
"  CRITICAL VALUE STICKER  CRITICAL VALUE:BUN 91 Creat. 4.63 GFR 14.13  RECEIVER (on-site recipient of call):Kristy P.  DATE & TIME NOTIFIED: 02-17-18 1441  MESSENGER (representative from lab):Saa  MD NOTIFIED: [place]  TIME OF NOTIFICATION:1230  RESPONSE:

## 2018-02-17 NOTE — Progress Notes (Signed)
Patient ID: Rodney [name], male    DOB: 1966/05/09  Age: 52 y.o. MRN: [id number]    Subjective:  Subjective  HPI OTHO MICHALIK presents for f/u er for hyperglycemia/ dm.   BS was over 400 on 4/1 so he went to pharmacy.  He has been able to keep his bs down in mid high 200 at home with diet.  No other complaints.  Review of Systems  Constitutional: Negative for appetite change, diaphoresis, fatigue and unexpected weight change.  Eyes: Negative for pain, redness and visual disturbance.  Respiratory: Negative for cough, chest tightness, shortness of breath and wheezing.   Cardiovascular: Negative for chest pain, palpitations and leg swelling.  Endocrine: Negative for cold intolerance, heat intolerance, polydipsia, polyphagia and polyuria.  Genitourinary: Negative for difficulty urinating, dysuria and frequency.  Neurological: Negative for dizziness, light-headedness, numbness and headaches.    History Past Medical History:  Diagnosis Date  . Chronic kidney disease    STAGE 4/on transplant list for Kidneys  . Diabetes mellitus    Type 2; diet controlled  . Gout   . Hx of adenomatous polyp of colon 06/08/2017  . Hyperlipidemia   . Hypertension     He has a past surgical history that includes Esophagogastroduodenoscopy (N/A, 08/17/2015).   His family history includes Cancer (age of onset: 48) in his mother; Heart disease in his maternal aunt and maternal uncle; Heart disease (age of onset: 65) in his mother; Hyperlipidemia in his father; Hypertension in his father; Stroke in his father.He reports that he has never smoked. He has never used smokeless tobacco. He reports that he does not drink alcohol or use drugs.  Current Outpatient Medications on File Prior to Visit  Medication Sig Dispense Refill  . allopurinol (ZYLOPRIM) 100 MG tablet Take 100 mg by mouth 2 (two) times daily.    . calcitRIOL (ROCALTROL) 0.25 MCG capsule Take 1 capsule by mouth daily.  0  . carvedilol (COREG) 25 MG  tablet Take 25 mg by mouth 2 (two) times daily with a meal.    . cyanocobalamin (,VITAMIN B-12,) 1000 MCG/ML injection INJECT 1 ML INTO THE MUSCLE EVERY 30 DAYS. 3 mL 4  . ferrous sulfate 325 (65 FE) MG tablet Take 1 tablet (325 mg total) by mouth daily with breakfast. 30 tablet 3  . hydrALAZINE (APRESOLINE) 50 MG tablet Take 1 tablet (50 mg total) by mouth 3 (three) times daily. 90 tablet 0  . Omega-3 Fatty Acids (FISH OIL) 1000 MG CAPS Take 2 capsules by mouth 2 (two) times daily.      Marland Kitchen omeprazole (PRILOSEC) 40 MG capsule Take 1 capsule (40 mg total) by mouth daily. 30 capsule 0  . spironolactone (ALDACTONE) 25 MG tablet Take 25 mg by mouth daily.    . SYRINGE/NEEDLE, DISP, 1 ML (BD ECLIPSE SYRINGE) 25G X 5/8" 1 ML MISC 1 mL by Does not apply route every 30 (thirty) days. 50 each 0   No current facility-administered medications on file prior to visit.      Objective:  Objective  Physical Exam  Constitutional: He is oriented to person, place, and time. Vital signs are normal. He appears well-developed and well-nourished. He is sleeping.  HENT:  Head: Normocephalic and atraumatic.  Mouth/Throat: Oropharynx is clear and moist.  Eyes: Pupils are equal, round, and reactive to light. EOM are normal.  Neck: Normal range of motion. Neck supple. No thyromegaly present.  Cardiovascular: Normal rate and regular rhythm.  No murmur heard. Pulmonary/Chest: Effort  normal and breath sounds normal. No respiratory distress. He has no wheezes. He has no rales. He exhibits no tenderness.  Musculoskeletal: He exhibits no edema or tenderness.  Neurological: He is alert and oriented to person, place, and time.  Skin: Skin is warm and dry.  Psychiatric: He has a normal mood and affect. His behavior is normal. Judgment and thought content normal.  Nursing note and vitals reviewed. Sensory exam of the foot is normal, tested with the monofilament. Good pulses, no lesions or ulcers, good peripheral  pulses. Diabetic Foot Exam - Simple   Simple Foot Form Diabetic Foot exam was performed with the following findings:  Yes 02/17/2018  2:33 PM  Visual Inspection No deformities, no ulcerations, no other skin breakdown bilaterally:  Yes Sensation Testing Intact to touch and monofilament testing bilaterally:  Yes Pulse Check Posterior Tibialis and Dorsalis pulse intact bilaterally:  Yes Comments    BP 132/80   Pulse 73   Temp 97.7 F (36.5 C) (Oral)   Resp 16   Ht '5\' 7"'  (1.702 m)   Wt 229 lb (103.9 kg)   SpO2 99%   BMI 35.87 kg/m  Wt Readings from Last 3 Encounters:  02/17/18 229 lb (103.9 kg)  02/13/18 225 lb (102.1 kg)  06/03/17 226 lb (102.5 kg)     Lab Results  Component Value Date   WBC 15.7 (H) 02/13/2018   HGB 12.1 (L) 02/13/2018   HCT 33.9 (L) 02/13/2018   PLT 225 02/13/2018   GLUCOSE 453 (H) 02/13/2018   CHOL 124 03/31/2017   TRIG 134.0 03/31/2017   HDL 28.50 (L) 03/31/2017   LDLDIRECT 96.0 03/25/2015   LDLCALC 69 03/31/2017   ALT 20 02/13/2018   AST 20 02/13/2018   NA 122 (L) 02/13/2018   K 3.9 02/13/2018   CL 91 (L) 02/13/2018   CREATININE 4.50 (H) 02/13/2018   BUN 95 (H) 02/13/2018   CO2 17 (L) 02/13/2018   TSH 3.96 08/01/2012   PSA 0.41 08/01/2012   INR 1.06 08/16/2015   HGBA1C 6.5 03/31/2017   MICROALBUR 37.8 (H) 03/31/2017    No results found.   Assessment & Plan:  Plan  I am having Ezri Landers. Prashad start on blood glucose meter kit and supplies, insulin degludec, and Insulin Pen Needle. I am also having him maintain his Fish Oil, SYRINGE/NEEDLE (DISP) 1 ML, ferrous sulfate, hydrALAZINE, omeprazole, spironolactone, carvedilol, allopurinol, cyanocobalamin, and calcitRIOL.  Meds ordered this encounter  Medications  . blood glucose meter kit and supplies KIT    Sig: Dispense based on patient and insurance preference. Use up to four times daily as directed. (FOR ICD-9 250.00, 250.01).    Dispense:  1 each    Refill:  0    Order Specific  Question:   Number of strips    Answer:   100    Order Specific Question:   Number of lancets    Answer:   100  . insulin degludec (TRESIBA FLEXTOUCH) 100 UNIT/ML SOPN FlexTouch Pen    Sig: 10 sq daily    Dispense:  3 mL    Refill:  3  . Insulin Pen Needle (NOVOFINE) 32G X 6 MM MISC    Sig: As directed    Dispense:  100 each    Refill:  2    Problem List Items Addressed This Visit      Unprioritized   Hyperlipidemia    Encouraged heart healthy diet, increase exercise, avoid trans fats, consider a krill oil  cap daily      Hypertension    Well controlled, no changes to meds. Encouraged heart healthy diet such as the DASH diet and exercise as tolerated.       Hypertension, renal disease, stage 1-4 or unspecified chronic kidney disease   Relevant Orders   Lipid panel   Hemoglobin A1c   Comprehensive metabolic panel   Stage 4 chronic kidney disease ([place])    Per nephrology      Uncontrolled type 2 diabetes mellitus with hyperglycemia ([place])    tresiba 10 u daily Check bs 1-2 x a day Refer to endo ' Check labs      Relevant Medications   blood glucose meter kit and supplies KIT   insulin degludec (TRESIBA FLEXTOUCH) 100 UNIT/ML SOPN FlexTouch Pen   Insulin Pen Needle (NOVOFINE) 32G X 6 MM MISC   Other Relevant Orders   Ambulatory referral to Endocrinology   Lipid panel   Hemoglobin A1c   Comprehensive metabolic panel    Other Visit Diagnoses    Hyperglycemia    -  Primary   Relevant Orders   POCT Urinalysis Dipstick (Automated) (Completed)   Chronic renal disease, stage IV (H[facility])       Relevant Orders   Comprehensive metabolic panel      Follow-up: Return in about 3 months (around 05/19/2018), or if symptoms worsen or fail to improve, for annual exam, fasting, diabetes II.  Ann Held, DO

## 2018-02-17 NOTE — Patient Instructions (Signed)

## 2018-03-23 ENCOUNTER — Ambulatory Visit: Payer: BLUE CROSS/BLUE SHIELD | Admitting: Endocrinology

## 2018-03-23 ENCOUNTER — Encounter: Payer: Self-pay | Admitting: Endocrinology

## 2018-03-23 VITALS — BP 118/80 | HR 111 | Wt 222.8 lb

## 2018-03-23 DIAGNOSIS — E1165 Type 2 diabetes mellitus with hyperglycemia: Secondary | ICD-10-CM | POA: Diagnosis not present

## 2018-03-23 MED ORDER — GLUCOSE BLOOD VI STRP: 1.0000 | ORAL_STRIP | Freq: Two times a day (BID) | 3 refills | Status: AC

## 2018-03-23 MED ORDER — INSULIN ASPART 100 UNIT/ML FLEXPEN
2.0000 [IU] | PEN_INJECTOR | Freq: Three times a day (TID) | SUBCUTANEOUS | 11 refills | Status: DC
Start: 2018-03-23 — End: 2018-04-20

## 2018-03-23 NOTE — Progress Notes (Signed)
Subjective:    Patient ID: Rodney [name], male    DOB: 11/26/1965, 52 y.o.   MRN: [id number]  HPI pt is referred by Dr Mikle Bosworth, for diabetes.  Pt states DM was dx'ed in 2008; he has mild if any neuropathy of the lower extremities, but he has associated renal failure; he was started on insulin a few weeks ago; pt says his diet and exercise are fair; he has never had pancreatitis, pancreatic surgery, severe hypoglycemia or DKA.  He takes levemir, 10 units qd.  Meter is downloaded today, and the printout is scanned into the record.  cbg varies from 165-200's.  It is in general higher as the day goes on.   Past Medical History:  Diagnosis Date  . Chronic kidney disease    STAGE 4/on transplant list for Kidneys  . Diabetes mellitus    Type 2; diet controlled  . Gout   . Hx of adenomatous polyp of colon 06/08/2017  . Hyperlipidemia   . Hypertension     Past Surgical History:  Procedure Laterality Date  . ESOPHAGOGASTRODUODENOSCOPY N/A 08/17/2015   Procedure: ESOPHAGOGASTRODUODENOSCOPY (EGD);  Surgeon: Wilford Corner, MD;  Location: [facility] ENDOSCOPY;  Service: Endoscopy;  Laterality: N/A;    Social History   Socioeconomic History  . Marital status: Married    Spouse name: Not on file  . Number of children: Not on file  . Years of education: Not on file  . Highest education level: Not on file  Occupational History  . Occupation: self employed  Social Needs  . Financial resource strain: Not on file  . Food insecurity:    Worry: Not on file    Inability: Not on file  . Transportation needs:    Medical: Not on file    Non-medical: Not on file  Tobacco Use  . Smoking status: Never Smoker  . Smokeless tobacco: Never Used  Substance and Sexual Activity  . Alcohol use: No  . Drug use: No  . Sexual activity: Yes    Partners: Female  Lifestyle  . Physical activity:    Days per week: Not on file    Minutes per session: Not on file  . Stress: Not on file  Relationships  . Social  connections:    Talks on phone: Not on file    Gets together: Not on file    Attends religious service: Not on file    Active member of club or organization: Not on file    Attends meetings of clubs or organizations: Not on file    Relationship status: Not on file  . Intimate partner violence:    Fear of current or ex partner: Not on file    Emotionally abused: Not on file    Physically abused: Not on file    Forced sexual activity: Not on file  Other Topics Concern  . Not on file  Social History Narrative   Exercise--no    Current Outpatient Medications on File Prior to Visit  Medication Sig Dispense Refill  . allopurinol (ZYLOPRIM) 100 MG tablet Take 100 mg by mouth 2 (two) times daily.    . calcitRIOL (ROCALTROL) 0.25 MCG capsule Take 1 capsule by mouth daily.  0  . carvedilol (COREG) 25 MG tablet Take 25 mg by mouth 2 (two) times daily with a meal.    . cyanocobalamin (,VITAMIN B-12,) 1000 MCG/ML injection INJECT 1 ML INTO THE MUSCLE EVERY 30 DAYS. 3 mL 4  . ferrous sulfate 325 (65 FE)  MG tablet Take 1 tablet (325 mg total) by mouth daily with breakfast. 30 tablet 3  . hydrALAZINE (APRESOLINE) 50 MG tablet Take 1 tablet (50 mg total) by mouth 3 (three) times daily. 90 tablet 0  . insulin degludec (TRESIBA FLEXTOUCH) 100 UNIT/ML SOPN FlexTouch Pen 10 sq daily 3 mL 3  . Insulin Pen Needle (NOVOFINE) 32G X 6 MM MISC As directed 100 each 2  . Omega-3 Fatty Acids (FISH OIL) 1000 MG CAPS Take 2 capsules by mouth 2 (two) times daily.      Marland Kitchen omeprazole (PRILOSEC) 40 MG capsule Take 1 capsule (40 mg total) by mouth daily. 30 capsule 0  . spironolactone (ALDACTONE) 25 MG tablet Take 25 mg by mouth daily.    . SYRINGE/NEEDLE, DISP, 1 ML (BD ECLIPSE SYRINGE) 25G X 5/8" 1 ML MISC 1 mL by Does not apply route every 30 (thirty) days. 50 each 0   No current facility-administered medications on file prior to visit.     Allergies  Allergen Reactions  . Simvastatin Other (See Comments)     REACTION: muscle aches    Family History  Problem Relation Age of Onset  . Heart disease Mother 46       CHF, stent,   . Cancer Mother 59       breast  . Stroke Father   . Hypertension Father   . Hyperlipidemia Father   . Heart disease Maternal Aunt   . Heart disease Maternal Uncle   . Colon cancer Neg Hx   . Diabetes Neg Hx     BP 118/80   Pulse (!) 111   Wt 222 lb 12.8 oz (101.1 kg)   SpO2 96%   BMI 34.90 kg/m    Review of Systems denies weight loss, blurry vision, headache, chest pain, sob, n/v, urinary frequency, muscle cramps, dry skin, memory loss, depression, cold intolerance, rhinorrhea, and easy bruising.        Objective:   Physical Exam VS: see vs page GEN: no distress HEAD: head: no deformity eyes: no periorbital swelling, no proptosis external nose and ears are normal mouth: no lesion seen NECK: supple, thyroid is not enlarged.  CHEST WALL: no deformity LUNGS: clear to auscultation CV: reg rate and rhythm, no murmur ABD: abdomen is soft, nontender.  no hepatosplenomegaly.  not distended.  no hernia MUSCULOSKELETAL: muscle bulk and strength are grossly normal.  no obvious joint swelling.  gait is normal and steady EXTEMITIES: no deformity.  no ulcer on the feet.  feet are of normal color and temp.  no edema PULSES: dorsalis pedis intact bilat.  no carotid bruit NEURO:  cn 2-12 grossly intact.   readily moves all 4's.  sensation is intact to touch on the feet SKIN:  Normal texture and temperature.  No rash or suspicious lesion is visible.   NODES:  None palpable at the neck PSYCH: alert, well-oriented.  Does not appear anxious nor depressed.   CT: normal pancreas  Lab Results  Component Value Date   HGBA1C 12.7 (H) 02/17/2018    I personally reviewed electrocardiogram tracing (08/16/15): Indication: renal failure Impression: NSR.  No MI.  No hypertrophy. Compared to 2012: 1D AVB is resolved  I have reviewed outside records, and summarized:    Pt was noted to have elevated a1c, and referred here.  He was seen in ER with severe hyperglycemia, and started on insulin     Assessment & Plan:  Insulin-requiring type 2 DM: severe exacerbation severe hyperglycemia,  neg FHx, and insulin sensitivity: these suggest pt is evolving type 1.   Patient Instructions  good diet and exercise significantly improve the control of your diabetes.  please let me know if you wish to be referred to a dietician.  high blood sugar is very risky to your health.  you should see an eye doctor and dentist every year.  It is very important to get all recommended vaccinations.  Controlling your blood pressure and cholesterol drastically reduces the damage diabetes does to your body.  Those who smoke should quit.  Please discuss these with your doctor.  Here is a new meter.  I have sent a prescription to your pharmacy, for strips. check your blood sugar twice a day.  vary the time of day when you check, between before the 3 meals, and at bedtime.  also check if you have symptoms of your blood sugar being too high or too low.  please keep a record of the readings and bring it to your next appointment here (or you can bring the meter itself).  You can write it on any piece of paper.  please call us sooner if your blood sugar goes below 70, or if you have a lot of readings over 200. For now, please: Please continue the same tresiba, and: Add "novolog," 2 units 3 times a day (just before each meal).  Please come back for a follow-up appointment in 2-3 weeks.  Please see Mickel Baas, our dietician, the same day.

## 2018-03-23 NOTE — Patient Instructions (Addendum)
good diet and exercise significantly improve the control of your diabetes.  please let me know if you wish to be referred to a dietician.  high blood sugar is very risky to your health.  you should see an eye doctor and dentist every year.  It is very important to get all recommended vaccinations.  Controlling your blood pressure and cholesterol drastically reduces the damage diabetes does to your body.  Those who smoke should quit.  Please discuss these with your doctor.  Here is a new meter.  I have sent a prescription to your pharmacy, for strips. check your blood sugar twice a day.  vary the time of day when you check, between before the 3 meals, and at bedtime.  also check if you have symptoms of your blood sugar being too high or too low.  please keep a record of the readings and bring it to your next appointment here (or you can bring the meter itself).  You can write it on any piece of paper.  please call us sooner if your blood sugar goes below 70, or if you have a lot of readings over 200. For now, please: Please continue the same tresiba, and: Add "novolog," 2 units 3 times a day (just before each meal).  Please come back for a follow-up appointment in 2-3 weeks.  Please see Mickel Baas, our dietician, the same day.

## 2018-04-11 ENCOUNTER — Telehealth: Payer: Self-pay | Admitting: Nutrition

## 2018-04-11 ENCOUNTER — Encounter: Payer: BLUE CROSS/BLUE SHIELD | Attending: Endocrinology | Admitting: Nutrition

## 2018-04-11 DIAGNOSIS — E1165 Type 2 diabetes mellitus with hyperglycemia: Secondary | ICD-10-CM | POA: Diagnosis not present

## 2018-04-11 DIAGNOSIS — Z713 Dietary counseling and surveillance: Secondary | ICD-10-CM | POA: Diagnosis not present

## 2018-04-11 DIAGNOSIS — Z6834 Body mass index (BMI) 34.0-34.9, adult: Secondary | ICD-10-CM | POA: Insufficient documentation

## 2018-04-11 DIAGNOSIS — Z794 Long term (current) use of insulin: Secondary | ICD-10-CM | POA: Insufficient documentation

## 2018-04-11 NOTE — Telephone Encounter (Signed)
Pt. Is here for diet consult.  His FBS is in the 240s and says they are in the 250s the rest of day.  Taking 10u Tresiba in the AM and 2u of Novolog ac meals.

## 2018-04-11 NOTE — Patient Instructions (Signed)
Increase Novolog to 3u before meals. Test blood sugar fasting, before supper and at bedtime.

## 2018-04-11 NOTE — Progress Notes (Signed)
Pt. Is here with wife to learn about diet.  He is on a kidney transplant list, and has limited his potasium and protein.   FBSs are in the 240s-270s and in the 200s during the rest of the day.  Typical day/meals: 8AM: up, goes to work, no food, just water 9:30-10 AM:  Grilled chicken biscuit and water to drink 2PM;  2 Nabs crackers, 5PM: 1-2 Nab crackers 7PM: 3-4 ounces protein--trying not to fry foods, but occasionally it is fried chicken. 2 starchy veg. Servings and 1 non starchy veg.   9PM;  ocassional piece of fruit- no ornages or bananas due to high potasium.    Discussed portion sizes of meal, and starchy veg., and the need for balancing meals.  He does not eat lunch, and is trying to drop another 10 pounds.    Insulin dose:  Tresiba 10 u in AM, and Novolog: 2u acB and supper.    Per Dr. Cordelia Pen verbal order, patient's Novolog dose was increase to 3u ac meals.  Encouraged him to test ac and 2hr. pc S and Breakfast and record the results for his upcoming visit with Dr. Loanne Drilling in 8 days.  He agreed to do this and had no final questions.

## 2018-04-11 NOTE — Telephone Encounter (Signed)
LVM for patient to call back so I can go over dosage increase.

## 2018-04-11 NOTE — Telephone Encounter (Signed)
Please increase novolog to 3 units 3 times a day (just before each meal)

## 2018-04-11 NOTE — Telephone Encounter (Signed)
I tried to call patient again, but only received VM.

## 2018-04-20 ENCOUNTER — Ambulatory Visit: Payer: BLUE CROSS/BLUE SHIELD | Admitting: Endocrinology

## 2018-04-20 ENCOUNTER — Encounter: Payer: Self-pay | Admitting: Endocrinology

## 2018-04-20 VITALS — BP 132/90 | HR 84 | Wt 215.8 lb

## 2018-04-20 DIAGNOSIS — E1165 Type 2 diabetes mellitus with hyperglycemia: Secondary | ICD-10-CM | POA: Diagnosis not present

## 2018-04-20 LAB — POCT GLYCOSYLATED HEMOGLOBIN (HGB A1C): Hemoglobin A1C: 9.7 % — AB (ref 4.0–5.6)

## 2018-04-20 MED ORDER — INSULIN ASPART 100 UNIT/ML FLEXPEN: 5.0000 [IU] | PEN_INJECTOR | Freq: Three times a day (TID) | SUBCUTANEOUS | 11 refills | Status: DC

## 2018-04-20 NOTE — Progress Notes (Signed)
Subjective:    Patient ID: Rodney [name], male    DOB: September 17, 1966, 52 y.o.   MRN: [id number]  HPI Pt returns for f/u of diabetes mellitus: DM type: 1 (dx'ed by severe hyperglycemia, neg FHx, and insulin sensitivity).  Dx'ed: 9381 Complications: renal failure Therapy: insulin since early 2019 DKA: never Severe hypoglycemia: never Pancreatitis: never Pancreatic imaging: normal on 2016 [place] Other: he takes multiple daily injections Interval history: Meter is downloaded today, and the printout is scanned into the record.  It varies from 220-240.  There is no trend throughout the day.  pt states he feels well in general.  He has increased novolog to 3 units 3 times a day (just before each meal).  He says he never misses the insulin.   Past Medical History:  Diagnosis Date  . Chronic kidney disease    STAGE 4/on transplant list for Kidneys  . Diabetes mellitus    Type 2; diet controlled  . Gout   . Hx of adenomatous polyp of colon 06/08/2017  . Hyperlipidemia   . Hypertension     Past Surgical History:  Procedure Laterality Date  . ESOPHAGOGASTRODUODENOSCOPY N/A 08/17/2015   Procedure: ESOPHAGOGASTRODUODENOSCOPY (EGD);  Surgeon: Wilford Corner, MD;  Location: [facility] ENDOSCOPY;  Service: Endoscopy;  Laterality: N/A;    Social History   Socioeconomic History  . Marital status: Married    Spouse name: Not on file  . Number of children: Not on file  . Years of education: Not on file  . Highest education level: Not on file  Occupational History  . Occupation: self employed  Social Needs  . Financial resource strain: Not on file  . Food insecurity:    Worry: Not on file    Inability: Not on file  . Transportation needs:    Medical: Not on file    Non-medical: Not on file  Tobacco Use  . Smoking status: Never Smoker  . Smokeless tobacco: Never Used  Substance and Sexual Activity  . Alcohol use: No  . Drug use: No  . Sexual activity: Yes    Partners: Female  Lifestyle    . Physical activity:    Days per week: Not on file    Minutes per session: Not on file  . Stress: Not on file  Relationships  . Social connections:    Talks on phone: Not on file    Gets together: Not on file    Attends religious service: Not on file    Active member of club or organization: Not on file    Attends meetings of clubs or organizations: Not on file    Relationship status: Not on file  . Intimate partner violence:    Fear of current or ex partner: Not on file    Emotionally abused: Not on file    Physically abused: Not on file    Forced sexual activity: Not on file  Other Topics Concern  . Not on file  Social History Narrative   Exercise--no    Current Outpatient Medications on File Prior to Visit  Medication Sig Dispense Refill  . allopurinol (ZYLOPRIM) 100 MG tablet Take 100 mg by mouth 2 (two) times daily.    . calcitRIOL (ROCALTROL) 0.25 MCG capsule Take 1 capsule by mouth daily.  0  . carvedilol (COREG) 25 MG tablet Take 25 mg by mouth 2 (two) times daily with a meal.    . cyanocobalamin (,VITAMIN B-12,) 1000 MCG/ML injection INJECT 1 ML INTO THE MUSCLE  EVERY 30 DAYS. 3 mL 4  . ferrous sulfate 325 (65 FE) MG tablet Take 1 tablet (325 mg total) by mouth daily with breakfast. 30 tablet 3  . furosemide (LASIX) 40 MG tablet Take 40 mg by mouth.    Marland Kitchen glucose blood (CONTOUR NEXT TEST) test strip 1 each by Other route 2 (two) times daily. And lancets 2/day 180 each 3  . hydrALAZINE (APRESOLINE) 50 MG tablet Take 1 tablet (50 mg total) by mouth 3 (three) times daily. 90 tablet 0  . insulin degludec (TRESIBA FLEXTOUCH) 100 UNIT/ML SOPN FlexTouch Pen 10 sq daily 3 mL 3  . Insulin Pen Needle (NOVOFINE) 32G X 6 MM MISC As directed 100 each 2  . Omega-3 Fatty Acids (FISH OIL) 1000 MG CAPS Take 2 capsules by mouth 2 (two) times daily.      Marland Kitchen omeprazole (PRILOSEC) 40 MG capsule Take 1 capsule (40 mg total) by mouth daily. 30 capsule 0  . spironolactone (ALDACTONE) 25 MG  tablet Take 25 mg by mouth daily.    . SYRINGE/NEEDLE, DISP, 1 ML (BD ECLIPSE SYRINGE) 25G X 5/8" 1 ML MISC 1 mL by Does not apply route every 30 (thirty) days. 50 each 0   No current facility-administered medications on file prior to visit.     Allergies  Allergen Reactions  . Simvastatin Other (See Comments)    REACTION: muscle aches    Family History  Problem Relation Age of Onset  . Heart disease Mother 23       CHF, stent,   . Cancer Mother 47       breast  . Stroke Father   . Hypertension Father   . Hyperlipidemia Father   . Heart disease Maternal Aunt   . Heart disease Maternal Uncle   . Colon cancer Neg Hx   . Diabetes Neg Hx     BP 132/90   Pulse 84   Wt 215 lb 12.8 oz (97.9 kg)   SpO2 97%   BMI 33.80 kg/m    Review of Systems He denies hypoglycemia.      Objective:   Physical Exam VITAL SIGNS:  See vs page GENERAL: no distress Pulses: dorsalis pedis intact bilat.   MSK: no deformity of the feet CV: no leg edema Skin:  no ulcer on the feet.  normal color and temp on the feet. Neuro: sensation is intact to touch on the feet   Lab Results  Component Value Date   HGBA1C 9.7 (A) 04/20/2018       Assessment & Plan:  Type 1 DM: he needs increased rx Renal failure: in this setting, we need to emphasize mealtime insulin.  Noncompliance with cbg recording: I'll work around this as best I can.   Patient Instructions  Here is a new meter.  I have sent a prescription to your pharmacy, for strips. check your blood sugar twice a day.  vary the time of day when you check, between before the 3 meals, and at bedtime.  also check if you have symptoms of your blood sugar being too high or too low.  please keep a record of the readings and bring it to your next appointment here (or you can bring the meter itself).  You can write it on any piece of paper.  please call us sooner if your blood sugar goes below 70, or if you have a lot of readings over 200. Please  continue the same tresiba, and: Increase the novolog to 5  units 3 times a day (just before each meal).  Here is a brochure about the V-GO disposable insulin pump.  Please let us know if you want an appointment, with Vaughan Basta, for this.   Please come back for a follow-up appointment in 2 months.

## 2018-04-20 NOTE — Patient Instructions (Addendum)
Here is a new meter.  I have sent a prescription to your pharmacy, for strips. check your blood sugar twice a day.  vary the time of day when you check, between before the 3 meals, and at bedtime.  also check if you have symptoms of your blood sugar being too high or too low.  please keep a record of the readings and bring it to your next appointment here (or you can bring the meter itself).  You can write it on any piece of paper.  please call us sooner if your blood sugar goes below 70, or if you have a lot of readings over 200. Please continue the same tresiba, and: Increase the novolog to 5 units 3 times a day (just before each meal).  Here is a brochure about the V-GO disposable insulin pump.  Please let us know if you want an appointment, with Vaughan Basta, for this.   Please come back for a follow-up appointment in 2 months.

## 2018-07-05 ENCOUNTER — Ambulatory Visit: Payer: BLUE CROSS/BLUE SHIELD | Admitting: Endocrinology

## 2018-08-03 ENCOUNTER — Telehealth: Payer: Self-pay | Admitting: *Deleted

## 2018-08-03 NOTE — Telephone Encounter (Signed)
Spoke w/ Pt's wife Roland Rack- informed Pt is O+.

## 2018-08-03 NOTE — Telephone Encounter (Signed)
Copied from [place] [phone]. Topic: Quick Communication - See Telephone Encounter >> Aug 01, 2018  1:13 PM Gardiner Ramus wrote: CRM for notification. See Telephone encounter for: 08/01/18. Pt wife called and stated that they would like to know pt blood type. Please advise

## 2018-09-22 ENCOUNTER — Telehealth: Payer: Self-pay | Admitting: Endocrinology

## 2018-09-22 NOTE — Telephone Encounter (Signed)
Ok, I need to know what ins you have.  Epic says [employer], but this is apparenty up to date

## 2018-09-22 NOTE — Telephone Encounter (Signed)
Patients wife called to advise that the RX card they were given to assist with the [place] RX requires them to have [employer] and it will not assist them with the cost of the RX. She is asking for additional resources for assistance.

## 2018-09-22 NOTE — Telephone Encounter (Signed)
Do you know of any other assistance for this patient to assist with cost of Tresiba?

## 2018-09-25 ENCOUNTER — Other Ambulatory Visit: Payer: Self-pay

## 2018-09-25 MED ORDER — INSULIN NPH (HUMAN) (ISOPHANE) 100 UNIT/ML ~~LOC~~ SUSP: 10.0000 [IU] | Freq: Every day | SUBCUTANEOUS | 11 refills | Status: AC

## 2018-09-25 MED ORDER — INSULIN REGULAR HUMAN 100 UNIT/ML IJ SOLN: 5.0000 [IU] | Freq: Three times a day (TID) | INTRAMUSCULAR | 11 refills | Status: AC

## 2018-09-25 NOTE — Telephone Encounter (Signed)
Change tresiba to NPH.  Same dosage.   Change novolog to reg.  Same dosage. Cheapest at [facility] Ov is due

## 2018-09-25 NOTE — Telephone Encounter (Signed)
Please follow up with pt

## 2018-09-25 NOTE — Telephone Encounter (Signed)
Called pt to clarify ins. States he is self pay at this time. Please advise.

## 2018-09-25 NOTE — Telephone Encounter (Signed)
Rx's sent to pt pharmacy as ordered by Dr. Loanne Drilling. Called pt and LVM to make him aware of this change.

## 2018-09-26 NOTE — Telephone Encounter (Signed)
Called pt and left detailed VM informing pt of the changes made to his insulin, available for pick up and location where Rx's were sent. Advised he also call to schedule an appt at hi earliest convenience.

## 2019-01-31 ENCOUNTER — Other Ambulatory Visit: Payer: Self-pay | Admitting: Family Medicine

## 2019-01-31 DIAGNOSIS — E538 Deficiency of other specified B group vitamins: Secondary | ICD-10-CM

## 2019-02-01 NOTE — Telephone Encounter (Signed)
Request from pharmacy. Vitamin B12 was checked was 03/31/2017 and last OV visit was 02/17/2018. Please advise.

## 2019-02-22 ENCOUNTER — Other Ambulatory Visit: Payer: Self-pay | Admitting: Family Medicine

## 2019-02-22 DIAGNOSIS — E538 Deficiency of other specified B group vitamins: Secondary | ICD-10-CM

## 2019-05-28 ENCOUNTER — Other Ambulatory Visit: Payer: Self-pay | Admitting: Endocrinology

## 2019-05-28 ENCOUNTER — Other Ambulatory Visit: Payer: Self-pay

## 2019-05-28 DIAGNOSIS — E1165 Type 2 diabetes mellitus with hyperglycemia: Secondary | ICD-10-CM

## 2019-05-29 ENCOUNTER — Other Ambulatory Visit: Payer: Self-pay | Admitting: Endocrinology

## 2019-06-01 ENCOUNTER — Other Ambulatory Visit (HOSPITAL_COMMUNITY): Payer: Self-pay | Admitting: *Deleted

## 2019-06-04 ENCOUNTER — Other Ambulatory Visit: Payer: Self-pay

## 2019-06-04 ENCOUNTER — Encounter (HOSPITAL_COMMUNITY)
Admission: RE | Admit: 2019-06-04 | Discharge: 2019-06-04 | Disposition: A | Discharge status: Home / Self Care | Payer: BLUE CROSS/BLUE SHIELD | Source: Ambulatory Visit | Attending: Nephrology | Admitting: Nephrology

## 2019-06-04 DIAGNOSIS — N189 Chronic kidney disease, unspecified: Secondary | ICD-10-CM | POA: Diagnosis present

## 2019-06-04 DIAGNOSIS — D631 Anemia in chronic kidney disease: Secondary | ICD-10-CM | POA: Diagnosis not present

## 2019-06-04 MED ORDER — SODIUM CHLORIDE 0.9 % IV SOLN
510.0000 mg | INTRAVENOUS | Status: DC
  Administered 2019-06-04: 510 mg via INTRAVENOUS
  Filled 2019-06-04: qty 17

## 2019-06-04 NOTE — Discharge Instructions (Signed)

## 2019-06-11 ENCOUNTER — Other Ambulatory Visit: Payer: Self-pay

## 2019-06-11 ENCOUNTER — Ambulatory Visit (HOSPITAL_COMMUNITY)
Admission: RE | Admit: 2019-06-11 | Discharge: 2019-06-11 | Disposition: A | Discharge status: Home / Self Care | Payer: BLUE CROSS/BLUE SHIELD | Source: Ambulatory Visit | Attending: Nephrology | Admitting: Nephrology

## 2019-06-11 DIAGNOSIS — N189 Chronic kidney disease, unspecified: Secondary | ICD-10-CM | POA: Diagnosis present

## 2019-06-11 DIAGNOSIS — D631 Anemia in chronic kidney disease: Secondary | ICD-10-CM | POA: Diagnosis not present

## 2019-06-11 MED ORDER — SODIUM CHLORIDE 0.9 % IV SOLN
510.0000 mg | INTRAVENOUS | Status: DC
  Administered 2019-06-11: 510 mg via INTRAVENOUS
  Filled 2019-06-11: qty 17

## 2019-11-07 ENCOUNTER — Other Ambulatory Visit: Payer: Self-pay | Admitting: Endocrinology

## 2020-02-14 ENCOUNTER — Ambulatory Visit: Payer: BLUE CROSS/BLUE SHIELD | Attending: Internal Medicine

## 2020-02-14 DIAGNOSIS — Z23 Encounter for immunization: Secondary | ICD-10-CM

## 2020-02-14 NOTE — Progress Notes (Signed)
   Covid-19 Vaccination Clinic  Name:  Rodney [name]    MRN: [id number] DOB: 06/18/1966  02/14/2020  [name] was observed post Covid-19 immunization for 15 minutes without incident. He was provided with Vaccine Information Sheet and instruction to access the V-Safe system.   [name] was instructed to call 911 with any severe reactions post vaccine: Marland Kitchen Difficulty breathing  . Swelling of face and throat  . A fast heartbeat  . A bad rash all over body  . Dizziness and weakness   Immunizations Administered    Name Date Dose VIS Date Route   Pfizer COVID-19 Vaccine 02/14/2020 10:12 AM 0.3 mL 10/26/2019 Intramuscular   Manufacturer: [employer], [employer]   Lot: [id number]   [place]: [id number]

## 2020-03-10 ENCOUNTER — Ambulatory Visit: Payer: BLUE CROSS/BLUE SHIELD | Attending: Internal Medicine

## 2020-03-10 DIAGNOSIS — Z23 Encounter for immunization: Secondary | ICD-10-CM

## 2020-03-10 NOTE — Progress Notes (Signed)
   Covid-19 Vaccination Clinic  Name:  Rodney [name]    MRN: [id number] DOB: [birth year]  03/10/2020  [name] was observed post Covid-19 immunization for 15 minutes without incident. He was provided with Vaccine Information Sheet and instruction to access the V-Safe system.   [name] was instructed to call 911 with any severe reactions post vaccine: Marland Kitchen Difficulty breathing  . Swelling of face and throat  . A fast heartbeat  . A bad rash all over body  . Dizziness and weakness   Immunizations Administered    Name Date Dose VIS Date Route   Pfizer COVID-19 Vaccine 03/10/2020 10:11 AM 0.3 mL 01/09/2019 Intramuscular   Manufacturer: [place]   Lot: [id number]   [place]: [id number]

## 2022-07-28 ENCOUNTER — Encounter: Payer: Self-pay | Admitting: Internal Medicine
